# Patient Record
Sex: Male | Born: 1950
Health system: Southern US, Community
[De-identification: ages and names within clinical notes are randomized; demographics above are authoritative.]

## PROBLEM LIST (undated history)

## (undated) DIAGNOSIS — I252 Old myocardial infarction: Secondary | ICD-10-CM

## (undated) DIAGNOSIS — N201 Calculus of ureter: Secondary | ICD-10-CM

## (undated) DIAGNOSIS — Z87442 Personal history of urinary calculi: Secondary | ICD-10-CM

## (undated) DIAGNOSIS — E119 Type 2 diabetes mellitus without complications: Secondary | ICD-10-CM

## (undated) DIAGNOSIS — Z955 Presence of coronary angioplasty implant and graft: Secondary | ICD-10-CM

## (undated) DIAGNOSIS — I2510 Atherosclerotic heart disease of native coronary artery without angina pectoris: Secondary | ICD-10-CM

## (undated) HISTORY — PX: CORONARY ANGIOPLASTY WITH STENT PLACEMENT: SHX49

---

## 1996-12-11 DIAGNOSIS — I252 Old myocardial infarction: Secondary | ICD-10-CM

## 1996-12-11 HISTORY — DX: Old myocardial infarction: I25.2

## 2005-12-21 ENCOUNTER — Encounter: Admission: RE | Admit: 2005-12-21 | Discharge: 2005-12-21 | Payer: Self-pay | Admitting: Endocrinology

## 2006-09-20 IMAGING — US US SOFT TISSUE HEAD/NECK
1 series · 14 of 25 positions shown · non-contrast
Comparison: None.

CLINICAL DATA: Thyroid goiter felt on physical examination.

THYROID ULTRASOUND
TECHNIQUE: Ultrasound examination of the thyroid gland and adjacent soft tissue
structures was performed.

[Series 1: unknown · 0.10mm/px · 14 of 29 slices shown]
[im 1/29]
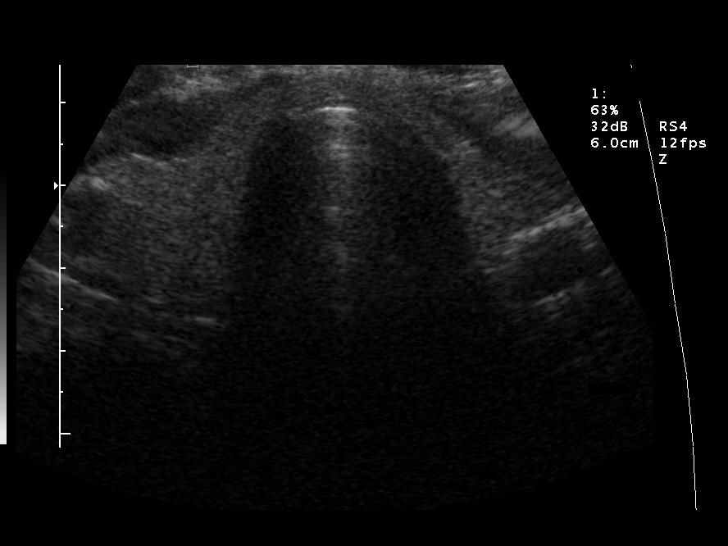
[im 3/29]
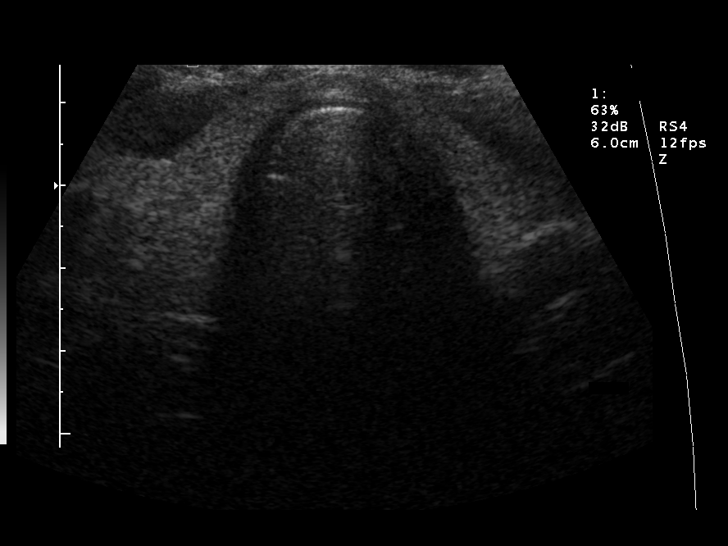
[im 5/29]
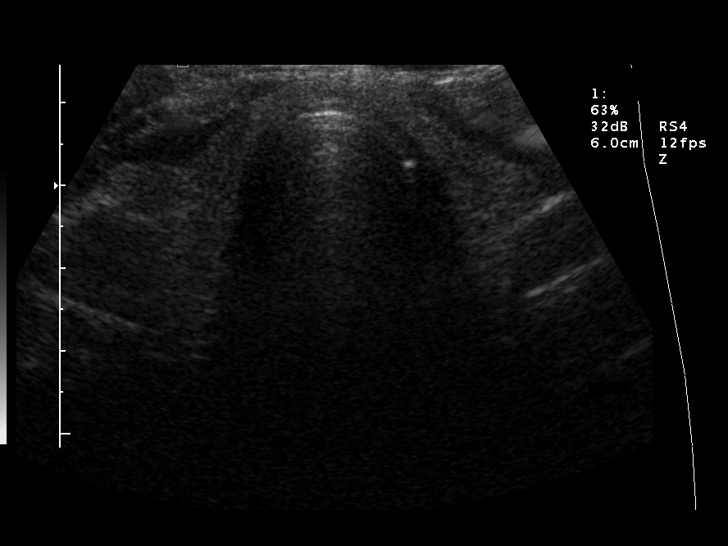
[im 8/29]
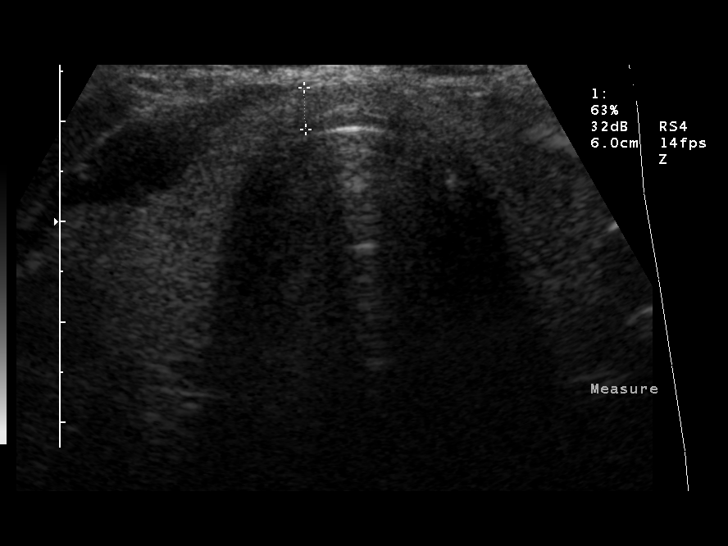
[im 10/29]
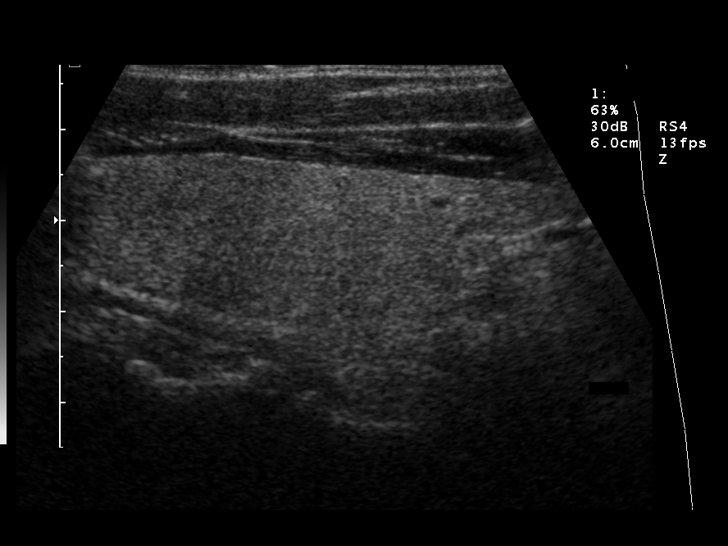
[im 11/29]
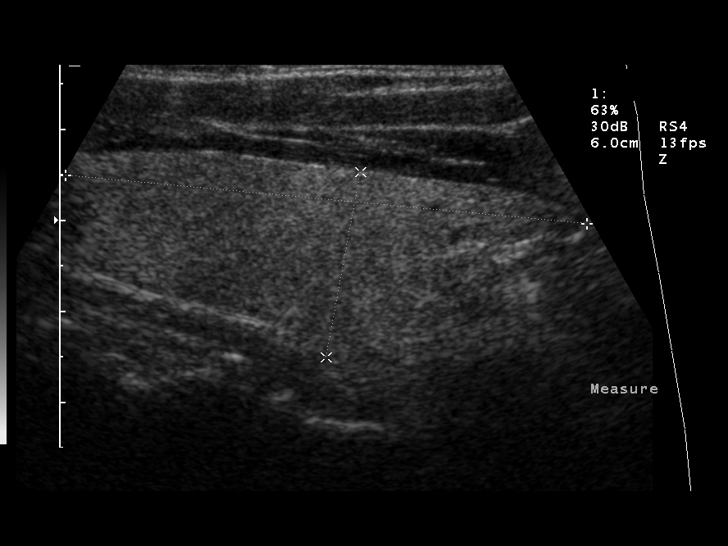
[im 13/29]
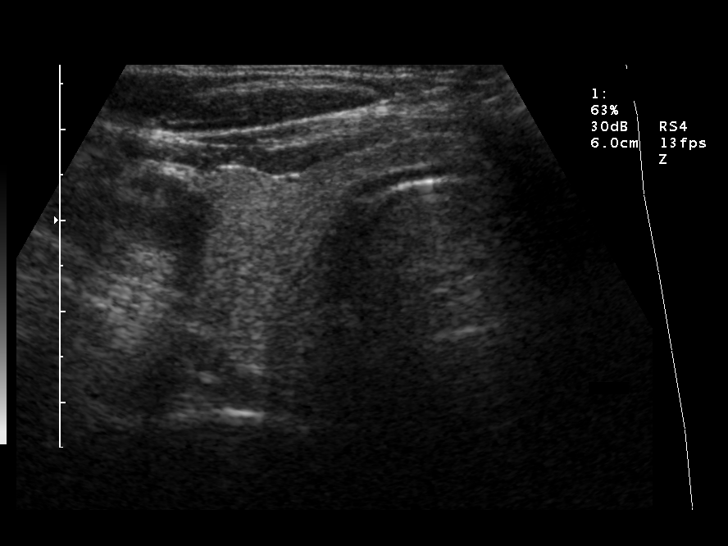
[im 16/29]
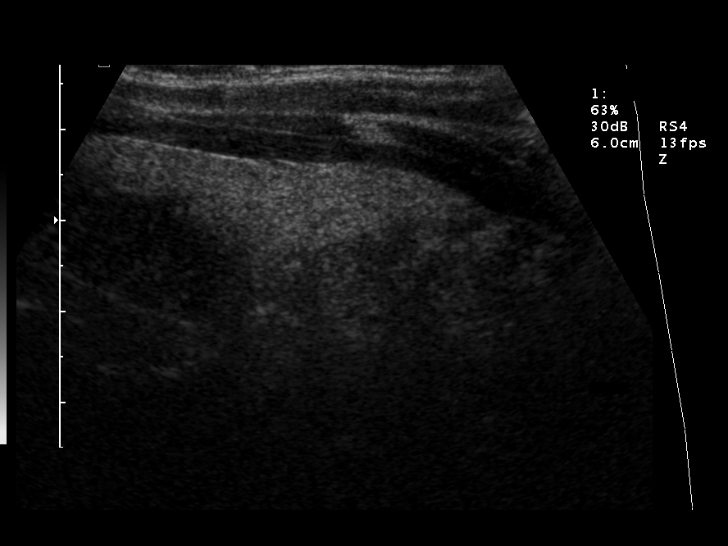
[im 18/29]
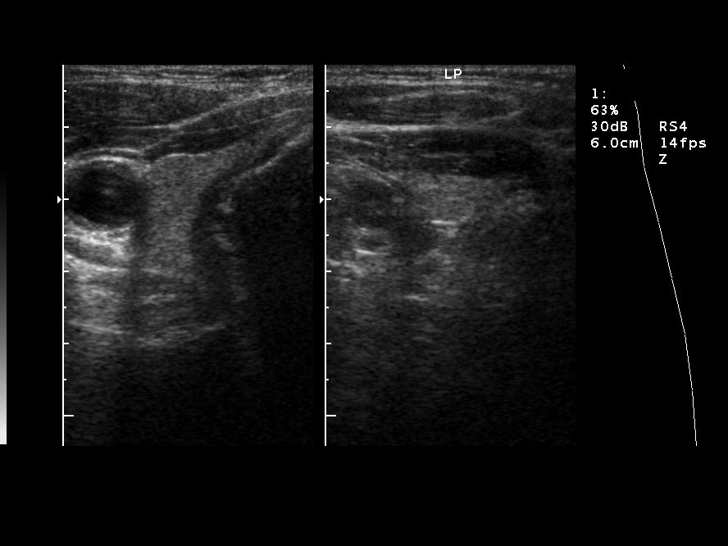
[im 19/29]
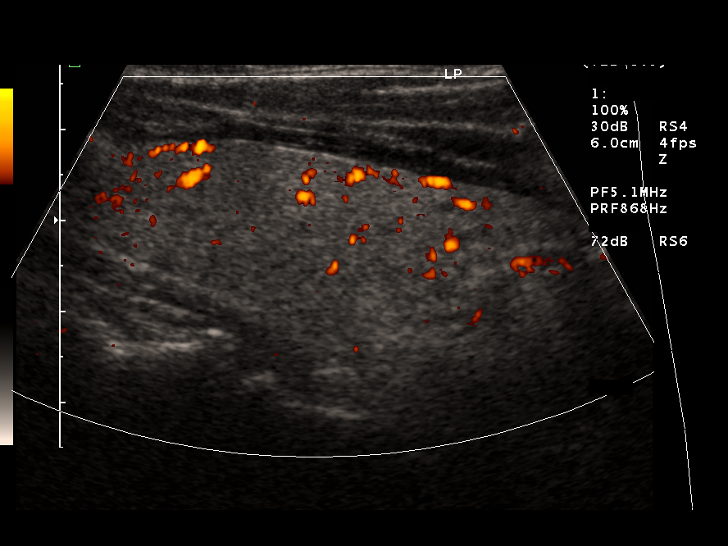
[im 22/29]
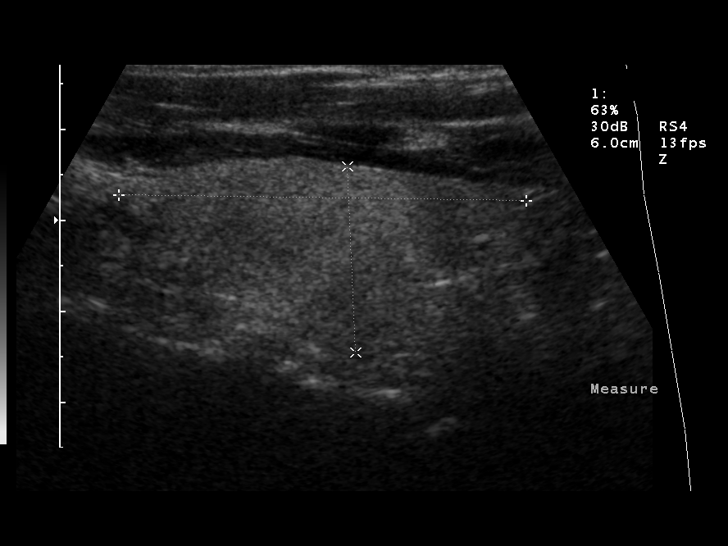
[im 24/29]
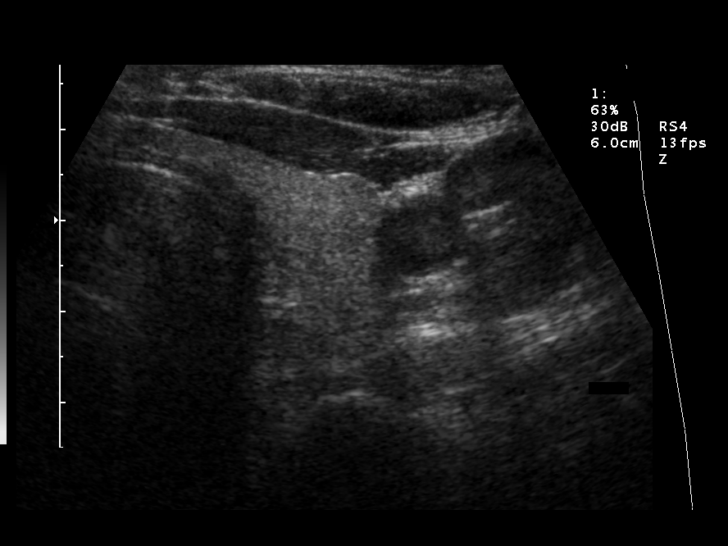
[im 26/29]
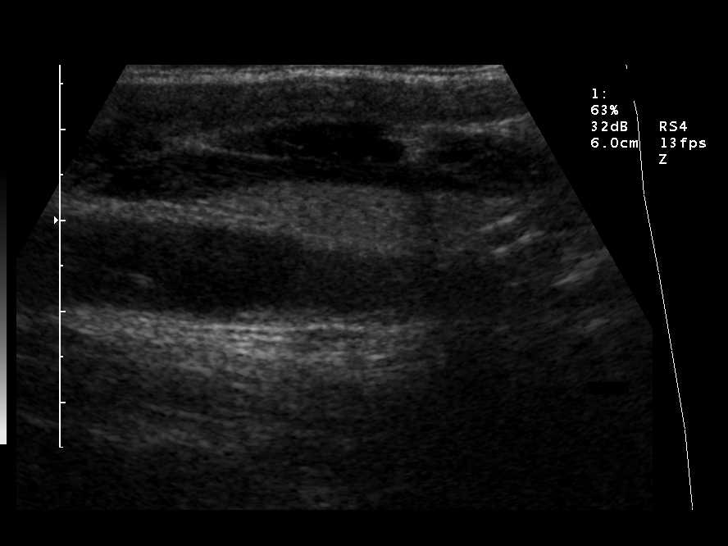
[im 29/29]
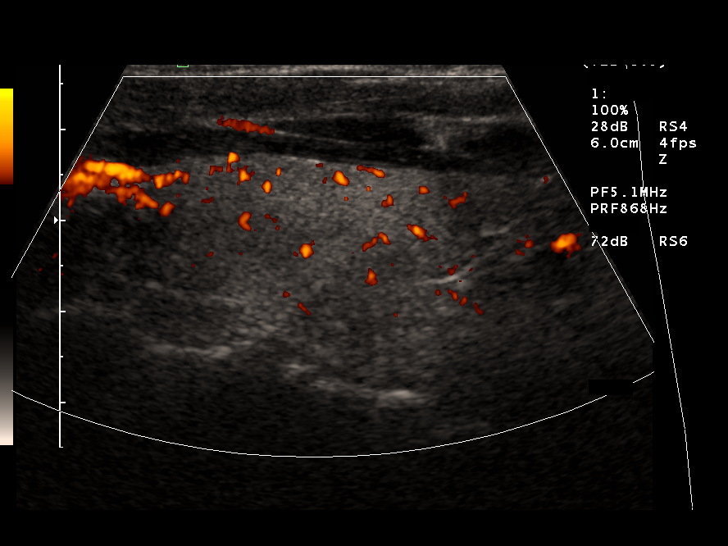

[14 of 25 positions shown; findings below may reference images not displayed]

FINDINGS: The right lobe of the thyroid gland is elongated, measuring 5.7 x
x 1.4 cm in maximum dimensions. The left lobe measures 4.6 x 1.8 x 1.4 cm in
maximum dimensions. The isthmus measures 3.8 mm in thickness in the midline. The
thyroid gland is homogeneous with no masses seen.

IMPRESSION

Elongated right lobe of the thyroid gland. Otherwise, normal examination.

## 2008-12-11 DIAGNOSIS — E119 Type 2 diabetes mellitus without complications: Secondary | ICD-10-CM

## 2008-12-11 HISTORY — DX: Type 2 diabetes mellitus without complications: E11.9

## 2011-03-02 ENCOUNTER — Inpatient Hospital Stay (HOSPITAL_COMMUNITY)
Admission: EM | Admit: 2011-03-02 | Discharge: 2011-03-04 | DRG: 247 | Disposition: A | Discharge status: Home / Self Care | Payer: Self-pay | Attending: Interventional Cardiology | Admitting: Interventional Cardiology

## 2011-03-02 ENCOUNTER — Emergency Department (HOSPITAL_COMMUNITY): Payer: Self-pay

## 2011-03-02 DIAGNOSIS — E785 Hyperlipidemia, unspecified: Secondary | ICD-10-CM | POA: Diagnosis present

## 2011-03-02 DIAGNOSIS — I2582 Chronic total occlusion of coronary artery: Secondary | ICD-10-CM | POA: Diagnosis present

## 2011-03-02 DIAGNOSIS — I2510 Atherosclerotic heart disease of native coronary artery without angina pectoris: Secondary | ICD-10-CM

## 2011-03-02 DIAGNOSIS — Z9861 Coronary angioplasty status: Secondary | ICD-10-CM

## 2011-03-02 DIAGNOSIS — I252 Old myocardial infarction: Secondary | ICD-10-CM

## 2011-03-02 DIAGNOSIS — E119 Type 2 diabetes mellitus without complications: Secondary | ICD-10-CM | POA: Diagnosis present

## 2011-03-02 DIAGNOSIS — I2119 ST elevation (STEMI) myocardial infarction involving other coronary artery of inferior wall: Principal | ICD-10-CM | POA: Diagnosis present

## 2011-03-02 DIAGNOSIS — I10 Essential (primary) hypertension: Secondary | ICD-10-CM | POA: Diagnosis present

## 2011-03-02 DIAGNOSIS — R61 Generalized hyperhidrosis: Secondary | ICD-10-CM | POA: Diagnosis present

## 2011-03-02 DIAGNOSIS — R0602 Shortness of breath: Secondary | ICD-10-CM | POA: Diagnosis present

## 2011-03-02 DIAGNOSIS — Z01810 Encounter for preprocedural cardiovascular examination: Secondary | ICD-10-CM

## 2011-03-02 DIAGNOSIS — R079 Chest pain, unspecified: Secondary | ICD-10-CM

## 2011-03-02 DIAGNOSIS — Z01812 Encounter for preprocedural laboratory examination: Secondary | ICD-10-CM

## 2011-03-02 DIAGNOSIS — Z7982 Long term (current) use of aspirin: Secondary | ICD-10-CM

## 2011-03-02 DIAGNOSIS — T82897A Other specified complication of cardiac prosthetic devices, implants and grafts, initial encounter: Secondary | ICD-10-CM | POA: Diagnosis present

## 2011-03-02 DIAGNOSIS — Y840 Cardiac catheterization as the cause of abnormal reaction of the patient, or of later complication, without mention of misadventure at the time of the procedure: Secondary | ICD-10-CM | POA: Diagnosis present

## 2011-03-02 LAB — BASIC METABOLIC PANEL
BUN: 20 mg/dL (ref 6–23)
BUN: 19 mg/dL (ref 6–23)
CO2: 30 mEq/L (ref 19–32)
Chloride: 101 mEq/L (ref 96–112)
GFR calc Af Amer: >60 mL/min (ref >60)
GFR calc Af Amer: >60 mL/min (ref >60)
GFR calc non Af Amer: >60 mL/min (ref >60)
Glucose, Bld: 241 mg/dL — ABNORMAL HIGH (ref 70–99)
Potassium: 3.6 mEq/L (ref 3.5–5.1)
Potassium: 4.6 mEq/L (ref 3.5–5.1)

## 2011-03-02 LAB — DIFFERENTIAL
Basophils Absolute: 0 10*3/uL (ref 0.0–0.1)
Basophils Relative: 0 % (ref 0–1)
Lymphs Abs: 4.6 10*3/uL — ABNORMAL HIGH (ref 0.7–4.0)

## 2011-03-02 LAB — CBC
HCT: 42.9 % (ref 39.0–52.0)
Hemoglobin: 15.5 g/dL (ref 13.0–17.0)
Hemoglobin: 14.3 g/dL (ref 13.0–17.0)
MCH: 31.8 pg (ref 26.0–34.0)
MCHC: 36 g/dL (ref 30.0–36.0)
MCV: 87.2 fL (ref 78.0–100.0)
RBC: 4.92 MIL/uL (ref 4.22–5.81)
RDW: 12.4 % (ref 11.5–15.5)
WBC: 13.6 10*3/uL — ABNORMAL HIGH (ref 4.0–10.5)

## 2011-03-02 LAB — POCT I-STAT, CHEM 8
BUN: 21 mg/dL (ref 6–23)
Chloride: 100 mEq/L (ref 96–112)
HCT: 38 % — ABNORMAL LOW (ref 39.0–52.0)
Hemoglobin: 12.9 g/dL — ABNORMAL LOW (ref 13.0–17.0)
Sodium: 135 mEq/L (ref 135–145)

## 2011-03-02 LAB — MRSA PCR SCREENING: MRSA by PCR: NEGATIVE

## 2011-03-02 LAB — POCT CARDIAC MARKERS
CKMB, poc: <1 ng/mL — ABNORMAL LOW (ref 1.0–8.0)
Troponin i, poc: <0.05 ng/mL (ref 0.00–0.09)

## 2011-03-02 LAB — PROTIME-INR: INR: 0.86 (ref 0.00–1.49)

## 2011-03-02 LAB — POCT ACTIVATED CLOTTING TIME: Activated Clotting Time: 464 seconds

## 2011-03-02 LAB — GLUCOSE, CAPILLARY

## 2011-03-02 LAB — HEMOGLOBIN A1C: Mean Plasma Glucose: 194 mg/dL — ABNORMAL HIGH (ref <117)

## 2011-03-02 LAB — APTT: aPTT: 30 seconds (ref 24–37)

## 2011-03-02 LAB — CARDIAC PANEL(CRET KIN+CKTOT+MB+TROPI)
Total CK: 311 U/L — ABNORMAL HIGH (ref 7–232)
Troponin I: 5.12 ng/mL (ref 0.00–0.06)

## 2011-03-03 HISTORY — PX: CORONARY ANGIOPLASTY WITH STENT PLACEMENT: SHX49

## 2011-03-03 LAB — BASIC METABOLIC PANEL
Calcium: 8.5 mg/dL (ref 8.4–10.5)
Creatinine, Ser: 0.82 mg/dL (ref 0.4–1.5)

## 2011-03-03 LAB — POCT ACTIVATED CLOTTING TIME: Activated Clotting Time: 523 seconds

## 2011-03-03 LAB — CARDIAC PANEL(CRET KIN+CKTOT+MB+TROPI)
CK, MB: 19 ng/mL (ref 0.3–4.0)
Total CK: 262 U/L — ABNORMAL HIGH (ref 7–232)

## 2011-03-04 LAB — CBC
HCT: 40.3 % (ref 39.0–52.0)
MCH: 31.3 pg (ref 26.0–34.0)
MCHC: 35 g/dL (ref 30.0–36.0)
MCV: 89.4 fL (ref 78.0–100.0)
RDW: 12.4 % (ref 11.5–15.5)

## 2011-03-04 LAB — HEMOGLOBIN A1C
Hgb A1c MFr Bld: 8.8 % — ABNORMAL HIGH (ref <5.7)
Mean Plasma Glucose: 206 mg/dL — ABNORMAL HIGH (ref <117)

## 2011-03-04 LAB — BASIC METABOLIC PANEL
BUN: 9 mg/dL (ref 6–23)
CO2: 27 mEq/L (ref 19–32)
Calcium: 9 mg/dL (ref 8.4–10.5)
GFR calc non Af Amer: >60 mL/min (ref >60)
Glucose, Bld: 191 mg/dL — ABNORMAL HIGH (ref 70–99)

## 2011-03-09 NOTE — H&P (Signed)
NAMECAROLYN, MANISCALCO             ACCOUNT NO.:  [id number]  MEDICAL RECORD NO.:  [id number]           PATIENT TYPE:  I  LOCATION:  2901                         FACILITY:  [facility]  PHYSICIAN:  Harlon Flor, MD   DATE OF BIRTH:  1951-11-30  DATE OF ADMISSION:  03/02/2011 DATE OF DISCHARGE:                             HISTORY & PHYSICAL   ADMISSION DIAGNOSIS:  Inferior ST-segment elevation myocardial infarction.  PRIMARY CARDIOLOGIST:  Lyn Records, MD.  CHIEF COMPLAINT:  Chest pain.  HISTORY OF PRESENT ILLNESS:  Mr. Rennert is a 60 year old white male with type 2 diabetes and coronary artery disease with previous PCI at [facility] in 1998 to his left circumflex, who presents to the ER tonight with acute onset of substernal chest pain, radiating to his arm that began at midnight tonight.  Pain was not going away on its own and he presented to the emergency room.  He also was somewhat short of breath and diaphoretic.  He had experienced some brief episodes of nausea as well.  He was identified as having an inferior ST-elevation MI and the cath lab was activated.  He has been taken there emergently now.  PAST MEDICAL HISTORY: 1. Coronary artery disease:  PCI to his left circumflex in 1998 at     [facility] per the patient's report. 2. Diabetes mellitus, type 2, on oral medications. 3. Hyperlipidemia. 4. Hypertension.  ALLERGIES:  No known drug allergies.  MEDICATIONS: 1. Metoprolol 50 mg b.i.d. 2. Metformin 1000 mg b.i.d. 3. Glimepiride 5 mg b.i.d. 4. Lisinopril 5 mg daily. 5. Simvastatin 40 mg daily. 6. Aspirin 325 daily. 7. Fish oil 1000 mg b.i.d.  FAMILY HISTORY:  He had a brother who had an MI in his 8s.  SOCIAL HISTORY:  The patient is a retired Clinical research associate.  He has a history of tobacco use up until recently.  Drinks alcohol occasionally.  REVIEW OF SYSTEMS:  Full review of systems is obtained and is negative except as described in HPI.  PHYSICAL  EXAM:  VITAL SIGNS:  Blood pressure 162/82, pulse 70, temperature afebrile, respirations 20. GENERAL:  No acute stress. HEENT:  Extraocular movements intact.  Oropharynx benign.  Nonicteric sclera. NECK:  Supple. CARDIOVASCULAR:  Regular rate and rhythm without murmurs, rubs, or gallops.  No jugular venous distention. LUNGS:  Clear to auscultation bilaterally. ABDOMEN:  Soft, nontender, nondistended. EXTREMITIES:  No clubbing, cyanosis, or edema.  Pulses, he has 2+ femoral, 2+ dorsalis pedis and posterior tibial pulses bilaterally. SKIN:  No rashes. LYMPH:  No lymphadenopathy. NEURO:  He moves all extremities well.  Cranial nerves II-XII are intact.  EKG shows normal sinus rhythm with inferior ST elevation and reciprocal changes in V1 and V2.  Chest x-ray and all of his lab work are pending.  ASSESSMENT AND PLAN:  Mr. Kolander is a 60 year old white male with type 2 diabetes and coronary artery disease with previous PCI to his left circumflex, who presents to the emergency room with an inferior ST- elevation myocardial infarction.  He has been taken emergently to the cath lab for primary PCI.  There is no contraindication with drug-  eluting stent.  He has been given heparin and aspirin.  Thienopyridine has been held until coronary angiography.  We will plan on putting him back on his home medications including metoprolol and his statin. Otherwise, hold his metformin and put him on a sliding scale insulin.     Harlon Flor, MD     MMB/MEDQ  D:  03/02/2011  T:  03/02/2011  Job:  [id number]  cc:   Lyn Records, M.D.  Electronically Signed by Meridee Score MD on 03/09/2011 08:17:08 PM

## 2011-03-13 NOTE — Procedures (Signed)
NAMEJAYON, [name]             ACCOUNT NO.:  [id number]  MEDICAL RECORD NO.:  [id number]           PATIENT TYPE:  I  LOCATION:  2901                         FACILITY:  [facility]  PHYSICIAN:  Veverly Fells. Excell Seltzer, MD  DATE OF BIRTH:  1951/04/11  DATE OF PROCEDURE:  03/02/2011 DATE OF DISCHARGE:                           CARDIAC CATHETERIZATION   PROCEDURE: 1. Left heart catheterization. 2. Selective coronary angiography. 3. Left ventricular angiography. 4. PTCA and stenting of the right coronary artery. 5. AngioSeal of the right femoral artery.  PROCEDURAL INDICATION:  [name] is a 60 year old gentleman with diabetes and coronary artery disease.  He is followed by Dr. Katrinka Blazing.  The patient underwent his last PCI procedure in 1998 by his report.  He has done very well since that time.  Tonight he developed left arm and chest tightness and was brought into the emergency department where his EKG showed inferior ST-segment elevation and a code STEMI was called.  He was brought emergently to the cardiac cath lab for cath and probable PCI.  Risks and indications of the procedure were reviewed with the patient. Emergency consent was obtained.  The right groin was prepped, draped, and anesthetized with 1% lidocaine using the modified Seldinger technique.  A 6-French sheath was placed in the right femoral artery.  A 6-French JL-4 diagnostic catheter was used to image the left coronary artery.  This demonstrated left-to-right collaterals to the right coronary artery which was a suspected infarct vessel.  A 6-French JR-4 guide catheter was inserted directly into the right coronary artery. The patient was given 60 mg of Effient and bivalirudin bolus and drip was started.  Once a therapeutic ACT was achieved, a cougar guidewire was used to cross the total occlusion in the mid right coronary artery within the stented segment.  The vessel was dilated with a 2.5 x 15-mm apex balloon which was  taken to 8 atmospheres.  This restored TIMI 3 flow.  The patient developed mild hypotension and bradycardia, but he did not require atropine.  There appeared to be diffuse in-stent restenosis in a short bare metal stent that had been previously placed in the RCA.  There was diffuse spasm following reperfusion and intracoronary nitroglycerin was administered.  This resolved the spasm. I elected to stent the vessel with a 3.5 x 20-mm Promus element drug- eluting stent.  The stent was carefully positioned and then was deployed at 16 atmospheres.  The stent appeared well expanded at the proximal distal edges but within the previously stented segment, the stent was somewhat underexpanded.  I postdilated the stent with 4.0 x 15 mm [place] apex balloon.  The balloon was taken to 18 atmospheres, maximum pressure on a total of three inflations.  This improved the expansion of the stented segment.  There was 10% residual stenosis.  There was TIMI 3 flow at the completion of the procedure.  The guide catheter was removed and a pigtail catheter was advanced into the left ventricle where ventriculography was performed.  A pullback across the aortic valve was done.  The femoral arteriotomy was then closed with an Angio-Seal device.  The patient tolerated  the procedure well.  There were no immediate complications.  FINDINGS:  Aortic pressure 133/77 with a mean of 100, left ventricular pressure 130/21.  Left ventriculography shows inferior hypokinesis.  Left ventricular ejection fraction is 50%.  Left mainstem:  The left main is widely patent.  There is no obstructive disease.  The left main divides into the LAD and left circumflex.  LAD:  The LAD is patent throughout its course to the left ventricular apex.  The proximal LAD has nonobstructive plaque with 30-40% stenosis. The first diagonal has diffuse 50-60% stenosis.  The mid LAD is patent. The apical portion of the LAD fills slowly.  Left  circumflex:  The proximal left circumflex is patent.  The mid left circumflex has an 80% focal stenosis and then a tandem 70% stenosis further down in the vessel.  There is a single obtuse marginal branch that is widely patent after this 70% lesion leading into that branch. The 2 lesions described appeared to be on the proximal and distal edges of a previously placed stent.  The stent itself is patent with mild diffuse in-stent restenosis.  Right coronary artery:  The right coronary artery is patent in the proximal aspect.  The mid segment is totally occluded within the stented segment with TIMI 0 flow and evidence of thrombus.  The PDA fills from left-to-right collaterals.  Left ventriculography shows inferior wall hypokinesis.  The left ventricular ejection fraction is estimated at 50%.  FINAL ASSESSMENT: 1. Total occlusion of the right coronary artery with successful     primary PCI using a drug-eluting stent platform. 2. Nonobstructive left anterior descending stenosis. 3. Severe left circumflex stenosis. 4. Mild segmental left ventricular systolic dysfunction.  RECOMMENDATIONS:  The patient will receive post MI medical therapy.  He was started on aspirin and prasugrel.  Consideration of staged PCI of the left circumflex at the discretion of Dr. Katrinka Blazing.     Veverly Fells. Excell Seltzer, MD     MDC/MEDQ  D:  03/02/2011  T:  03/02/2011  Job:  [id number]  cc:   Lyn Records, M.D. Dorisann Frames, M.D.  Electronically Signed by Tonny Bollman MD on 03/13/2011 01:13:15 PM

## 2011-03-31 NOTE — Cardiovascular Report (Signed)
  [name], [name]             ACCOUNT NO.:  [id number]  MEDICAL RECORD NO.:  [id number]           PATIENT TYPE:  O  LOCATION:  2807                         FACILITY:  [facility]  PHYSICIAN:  Lyn Records, M.D.   DATE OF BIRTH:  May 01, 1951  DATE OF PROCEDURE: DATE OF DISCHARGE:                           CARDIAC CATHETERIZATION   INDICATIONS FOR PROCEDURE:  Stage procedure following an acute inferior infarction where he presented with right coronary stent thrombosis.  He was found to have high-grade restenosis within a previously placed circumflex stent from 1995.  Study is being done to treat residual left circumflex disease.  PROCEDURE PERFORMED:  Drug-eluting stent implantation circumflex.  DESCRIPTION:  We performed this procedure via the right radial approach. Consent was obtained after discussing potential risks and benefits of the procedure including stroke, death, myocardial infarction, bypass surgery, bleeding and limb ischemia.  We obtained access without difficulty.  We used a 6-French system.  A left coronary XB 6-French 3.5 cm guide catheter was used.  Angiomax and bolus were given to get ACT greater than 300.  A 20 mg of Effient was administered.  He had been loaded with Effient 36 hours earlier.  We used BMW wire and predilated with a 25 x 20 mm long balloon.  We then deployed a XIENCE 2.75 x 33 mm long stent to cover the entire mid right coronary segment where there was restenosis pre and post the previously placed stent.  We deployed the stent at nominal pressure of 8 atmospheres.  We then postdilated with a 20-mm long x 2.75-mm [place] balloon to 15 atmospheres.  The proximal two-thirds of the stent was further dilated with high pressure using a 3.0 x 20 mm long [place] balloon to 15 atmospheres.  IV nitroglycerin was administered on multiple occasions, but prior to the final images to 100 mcg was also given.  A very nice angiographic result was obtained. Hemostasis was  achieved with a wristband.  CONCLUSION:  Successful stenting of the mid circumflex from tandem 90 and 60-70% stenoses to 0% with a single 33-mm long XIENCE drug-eluting stent with TIMI grade 3 flow result.  PLAN:  Effient and aspirin therapy indefinitely.  Discharge within 24-36 hours.     Lyn Records, M.D.     HWS/MEDQ  D:  03/03/2011  T:  03/04/2011  Job:  [id number]  Electronically Signed by Verdis Prime M.D. on 03/31/2011 04:54:56 PM

## 2011-03-31 NOTE — Discharge Summary (Signed)
[name], [name]             ACCOUNT NO.:  [id number]  MEDICAL RECORD NO.:  [id number]           PATIENT TYPE:  O  LOCATION:  6529                         FACILITY:  [facility]  PHYSICIAN:  Lyn Records, M.D.   DATE OF BIRTH:  1951-06-08  DATE OF ADMISSION:  03/02/2011 DATE OF DISCHARGE:  03/04/2011                              DISCHARGE SUMMARY   REASON FOR ADMISSION:  Acute inferior wall ST elevation MI.  DISCHARGE DIAGNOSES: 1. Acute inferior myocardial infarction treated with drug-eluting     stent implantation on March 02, 2011. 2. Coronary atherosclerotic heart disease with high-grade tandem     lesions in the circumflex coronary artery found at the time of     acute intervention, treated with drug-eluting stent on March 03, 2011. 3. History of prior infarctions but with low normal LV function and     inferior wall hypokinesis, EF 50%. 4. Diabetes mellitus. 5. Hyperlipidemia. 6. Hypertension.  PROCEDURES PERFORMED: 1. Emergency catheterization with RCA DES by Dr. Tonny Bollman on     March 02, 2011. 2. Elective circumflex tandem lesions stent by Dr. Verdis Prime on     March 03, 2011.  DISCHARGE RECOMMENDATIONS: 1. Follow up with Dr. Verdis Prime on March 15, 2011. 2. Utilize the cardiac rehabilitation instructions for activity     progression over the next 7-10 days. 3. Call if any recurrent arm discomfort (index symptom).  MEDICATIONS: 1. Aspirin 325 mg per day. 2. Prasugrel 10 mg per day. 3. Metoprolol tartrate 50 mg twice a day. 4. Nitroglycerin 0.4 mg sublingually if chest discomfort. 5. Glimepiride 4 mg 1 tablet twice daily. 6. Lisinopril 5 mg daily. 7. Metformin XR 500 mg 2 tablets twice daily. 8. Simvastatin 40 mg daily.  DIET:  Heart-healthy modified carbohydrate diet.  HISTORY AND PHYSICAL AND HOSPITAL COURSE:  Please review admitting history and physical.  The patient has longstanding history of coronary artery disease and had undergone  circumflex and right coronary artery stenting in the late 1990s at [facility] in [place].  He is a type 2 diabetic.  Early in the a.m. on March 03, 2011, he presented with inferior ST elevation and was noted to have total occlusion of the right coronary within the previously stented segment of the right coronary.  This vessel was successfully reopened and stented by Dr. Tonny Bollman.  A drug-eluting stent was placed.  Residual disease was found at the time of cath involving high-grade stenosis at the margins of a previously placed stent in the left circumflex from the late 1990s.  On March 03, 2011, the patient underwent successful stenting of this lesion.  Please refer to the procedure note.  Drug-eluting stent was also use in this territory.  On admission to the hospital and subsequently laboratory data of significance included a hemoglobin of 14.3, white count of 14,600, creatinine of 0.98, which was 0.82 on August 02, 2010, prior to circumflex stenting.  Peak CK-MB was 311/31.  The patient's EKG post infarction demonstrated inferior T-wave inversion.  Hemoglobin A1c is not available at the time of this dictation.  On the day  prior to discharge, the patient's radial cath site for the circumflex intervention and the right femoral cath site from the right coronary intervention are without evidence of local abnormality or hematoma.  The patient's condition at discharge presumed to recur on March 04, 2011, is improved.     Lyn Records, M.D.     HWS/MEDQ  D:  03/03/2011  T:  03/04/2011  Job:  [id number]  Electronically Signed by Verdis Prime M.D. on 03/31/2011 04:54:51 PM

## 2011-11-30 IMAGING — CR DG CHEST 1V PORT
2 series · 2 of 2 positions shown · non-contrast
Comparison: None.

CLINICAL DATA: Chest pain.

PORTABLE CHEST - 1 VIEW

[view not recorded (1 of 2)]
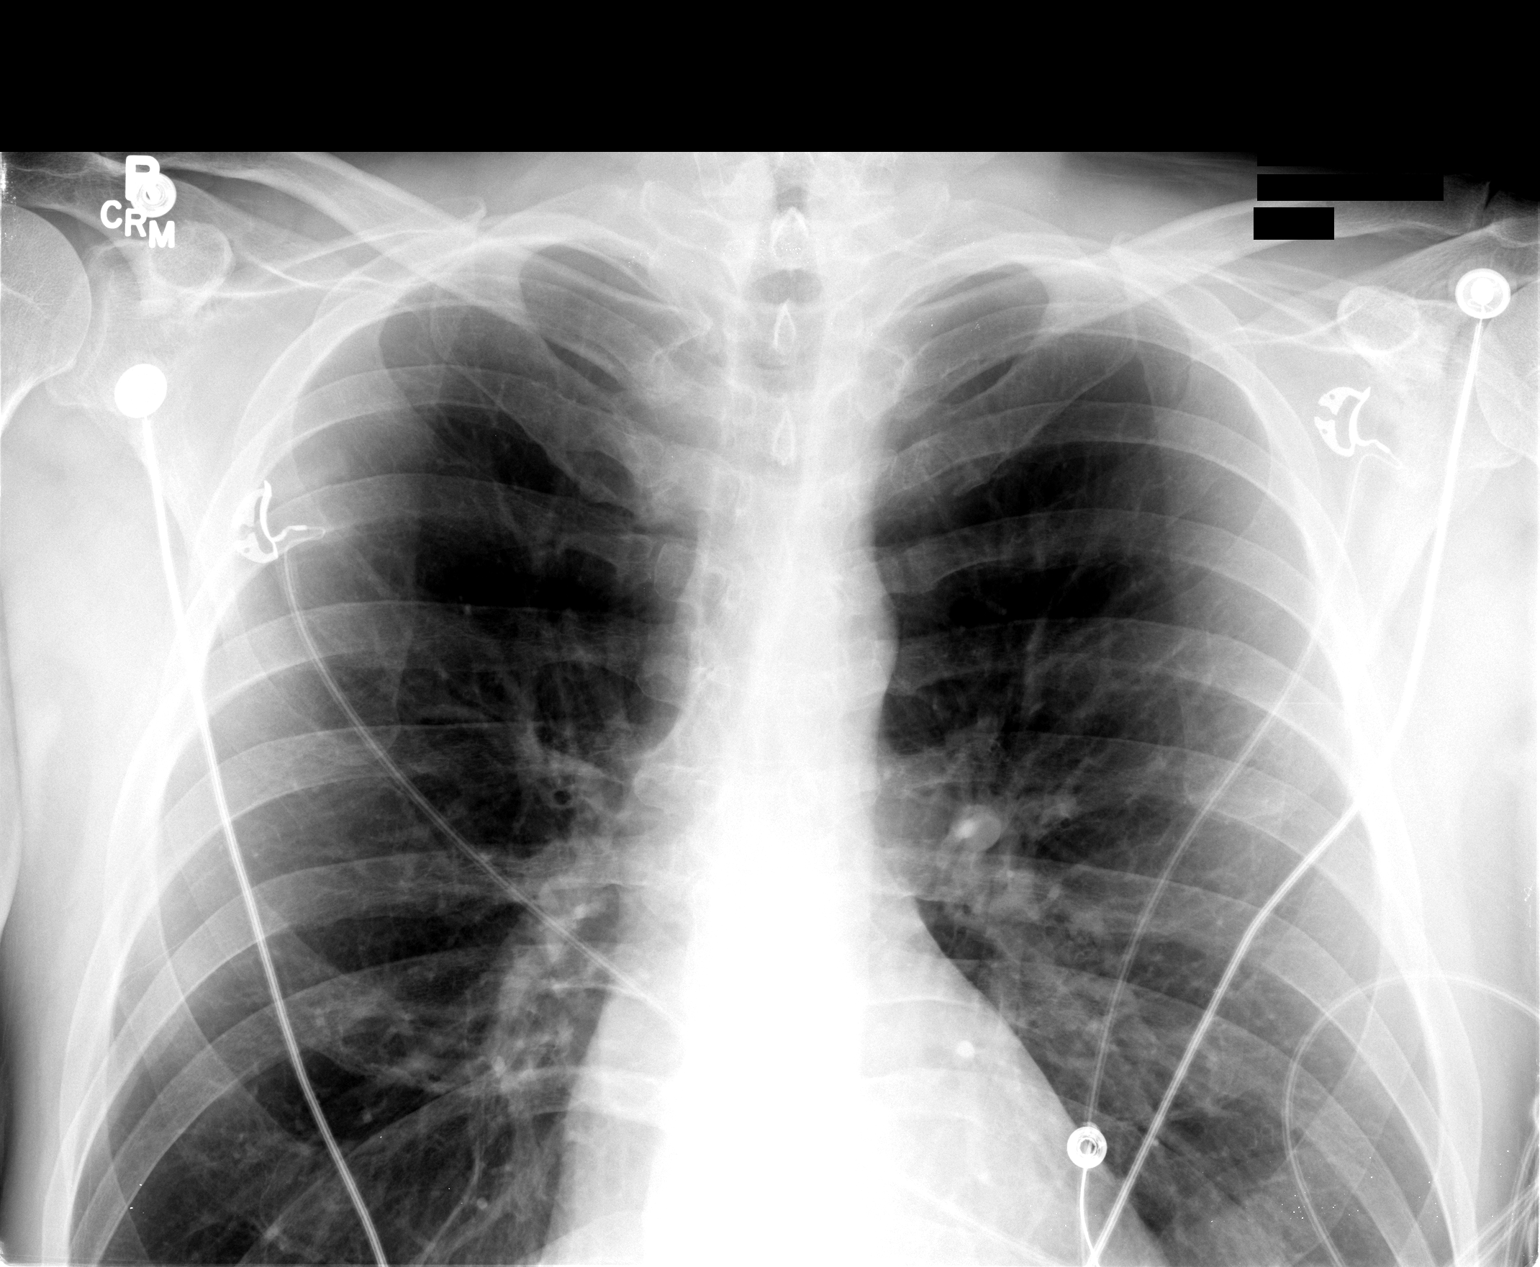

[view not recorded (2 of 2)]
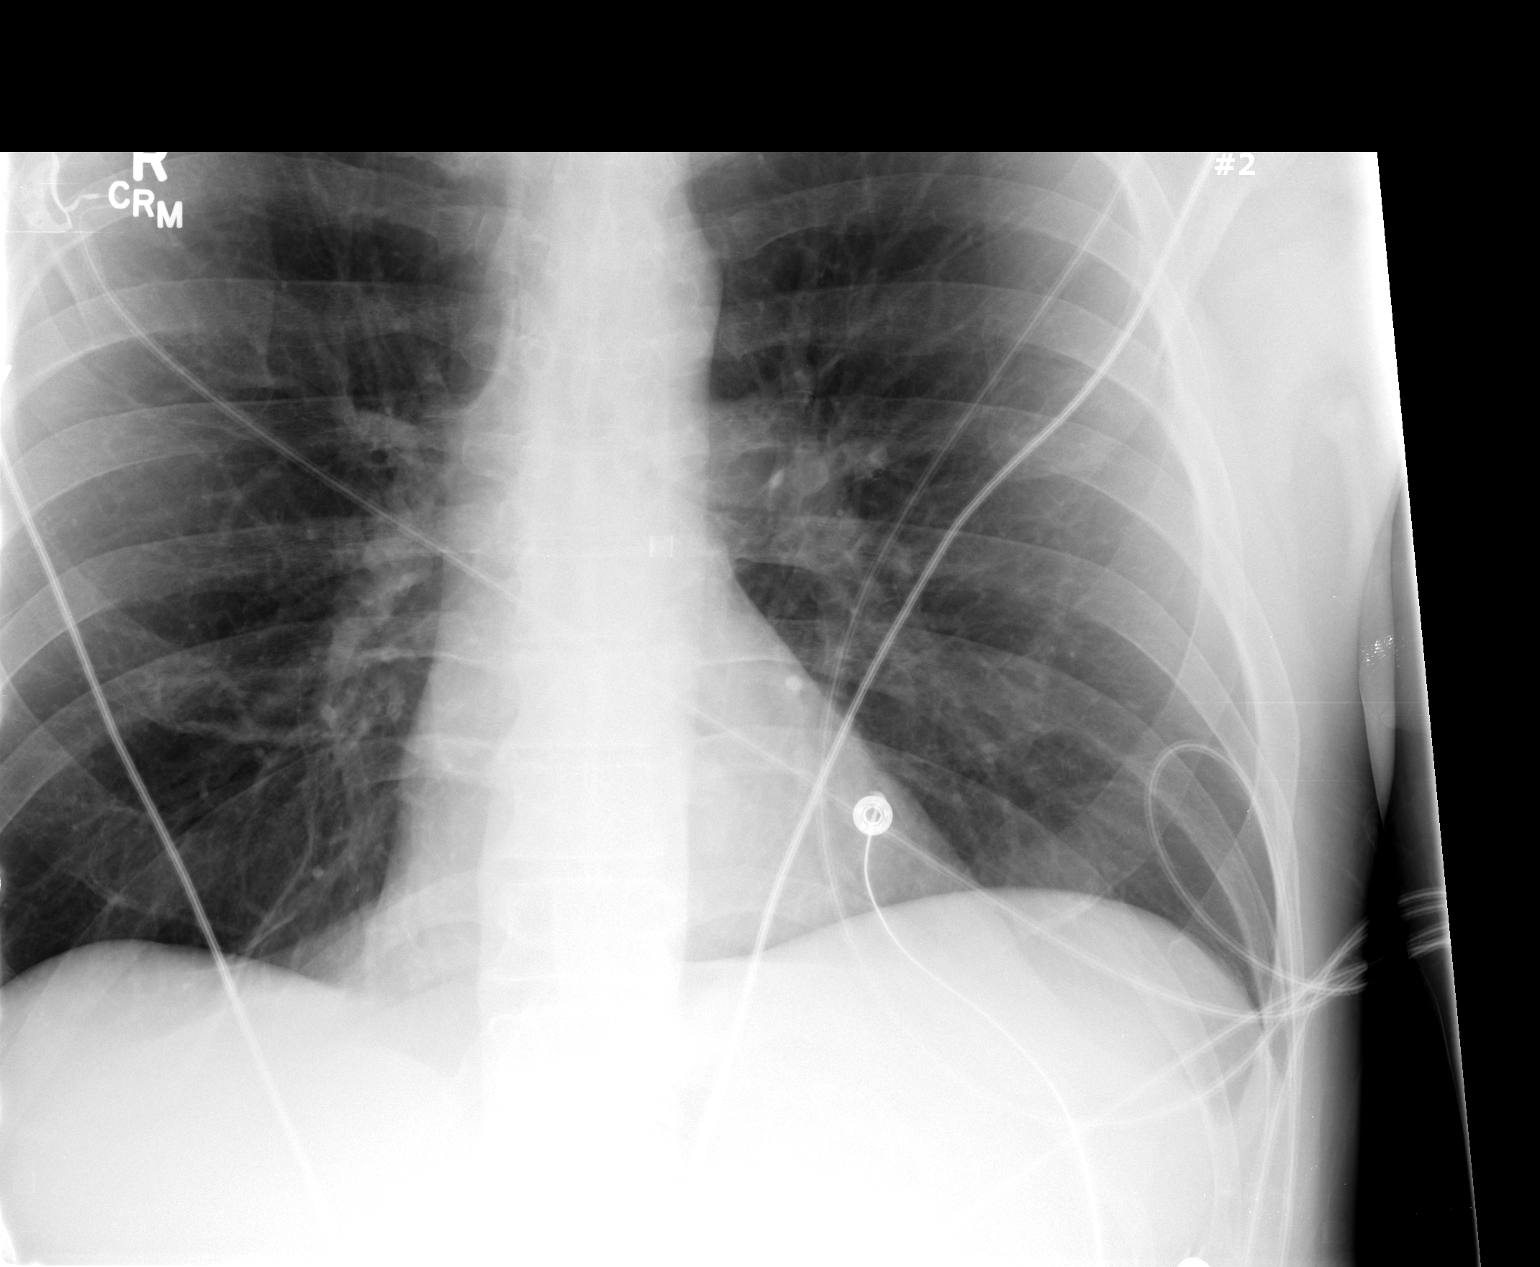

[2 of 2 positions shown; findings below may reference images not displayed]

FINDINGS: Right costophrenic angle is off the margin of the film.
Lungs appear clear.  Heart size normal.  No focal bony abnormality.
IMPRESSION: No acute finding.

## 2013-02-12 ENCOUNTER — Encounter (HOSPITAL_BASED_OUTPATIENT_CLINIC_OR_DEPARTMENT_OTHER): Payer: Self-pay | Admitting: *Deleted

## 2013-02-12 ENCOUNTER — Other Ambulatory Visit: Payer: Self-pay | Admitting: Urology

## 2013-02-12 NOTE — Progress Notes (Addendum)
To [facility] at 0615 -Istat,Ekg on arrival-Npo after Mn-will take metoprolol with small amt water that am-patient has left message with Dr Garnette Scheuermann regarding procedure and anticoagulant needs.Will have anesthesia review due to cardiac history.  Reviewed chart w dr Acey Lav, ok to proceed.

## 2013-02-13 ENCOUNTER — Encounter (HOSPITAL_BASED_OUTPATIENT_CLINIC_OR_DEPARTMENT_OTHER): Payer: Self-pay | Admitting: *Deleted

## 2013-02-16 ENCOUNTER — Encounter (HOSPITAL_BASED_OUTPATIENT_CLINIC_OR_DEPARTMENT_OTHER): Payer: Self-pay | Admitting: Anesthesiology

## 2013-02-16 NOTE — Anesthesia Preprocedure Evaluation (Deleted)
Anesthesia Evaluation  Patient identified by MRN, date of birth, ID band Patient awake    Reviewed: Allergy & Precautions, H&P , NPO status , Patient's Chart, lab work & pertinent test results, reviewed documented beta blocker date and time   Airway Mallampati: II TM Distance: >3 FB Neck ROM: full    Dental no notable dental hx.    Pulmonary neg pulmonary ROS,  breath sounds clear to auscultation  Pulmonary exam normal       Cardiovascular Exercise Tolerance: Good + CAD and + Cardiac Stents negative cardio ROS  Rhythm:regular Rate:Normal  STEMI 03/01/12   Neuro/Psych negative neurological ROS  negative psych ROS   GI/Hepatic negative GI ROS, Neg liver ROS,   Endo/Other  negative endocrine ROSdiabetes, Type 2, Insulin Dependent and Oral Hypoglycemic Agents  Renal/GU negative Renal ROS  negative genitourinary   Musculoskeletal   Abdominal   Peds  Hematology negative hematology ROS (+)   Anesthesia Other Findings   Reproductive/Obstetrics negative OB ROS                          Anesthesia Physical Anesthesia Plan  ASA: III  Anesthesia Plan: General   Post-op Pain Management:    Induction: Intravenous  Airway Management Planned: LMA  Additional Equipment:   Intra-op Plan:   Post-operative Plan: Extubation in OR  Informed Consent: I have reviewed the patients History and Physical, chart, labs and discussed the procedure including the risks, benefits and alternatives for the proposed anesthesia with the patient or authorized representative who has indicated his/her understanding and acceptance.   Dental Advisory Given  Plan Discussed with: CRNA  Anesthesia Plan Comments: (Patient passed stone last night. Surgery cancelled.)       Anesthesia Quick Evaluation

## 2013-02-17 ENCOUNTER — Ambulatory Visit (HOSPITAL_BASED_OUTPATIENT_CLINIC_OR_DEPARTMENT_OTHER)
Admission: RE | Admit: 2013-02-17 | Payer: No Typology Code available for payment source | Source: Ambulatory Visit | Admitting: Urology

## 2013-02-17 ENCOUNTER — Encounter (HOSPITAL_BASED_OUTPATIENT_CLINIC_OR_DEPARTMENT_OTHER): Payer: Self-pay | Admitting: Anesthesiology

## 2013-02-17 HISTORY — DX: Presence of coronary angioplasty implant and graft: Z95.5

## 2013-02-17 HISTORY — DX: Atherosclerotic heart disease of native coronary artery without angina pectoris: I25.10

## 2013-02-17 HISTORY — DX: Personal history of urinary calculi: Z87.442

## 2013-02-17 HISTORY — DX: Old myocardial infarction: I25.2

## 2013-02-17 HISTORY — DX: Calculus of ureter: N20.1

## 2013-02-17 HISTORY — DX: Type 2 diabetes mellitus without complications: E11.9

## 2013-02-17 SURGERY — CYSTOURETEROSCOPY, WITH RETROGRADE PYELOGRAM AND STENT INSERTION
Anesthesia: General | Laterality: Left

## 2014-01-11 ENCOUNTER — Other Ambulatory Visit: Payer: Self-pay | Admitting: *Deleted

## 2014-01-11 DIAGNOSIS — E782 Mixed hyperlipidemia: Secondary | ICD-10-CM

## 2014-03-09 ENCOUNTER — Other Ambulatory Visit (INDEPENDENT_AMBULATORY_CARE_PROVIDER_SITE_OTHER): Payer: No Typology Code available for payment source

## 2014-03-09 DIAGNOSIS — E782 Mixed hyperlipidemia: Secondary | ICD-10-CM

## 2014-03-09 LAB — HEPATIC FUNCTION PANEL
ALK PHOS: 89 U/L (ref 39–117)
ALT: 23 U/L (ref 0–53)
AST: 15 U/L (ref 0–37)
Albumin: 4.2 g/dL (ref 3.5–5.2)
Bilirubin, Direct: 0 mg/dL (ref 0.0–0.3)
TOTAL PROTEIN: 6.9 g/dL (ref 6.0–8.3)
Total Bilirubin: 0.6 mg/dL (ref 0.3–1.2)

## 2014-03-09 LAB — LIPID PANEL
Cholesterol: 160 mg/dL (ref 0–200)
HDL: 38.5 mg/dL — ABNORMAL LOW (ref >39.00)
LDL Cholesterol: 98 mg/dL (ref 0–99)
Total CHOL/HDL Ratio: 4
Triglycerides: 116 mg/dL (ref 0.0–149.0)
VLDL: 23.2 mg/dL (ref 0.0–40.0)

## 2014-05-19 ENCOUNTER — Encounter: Payer: Self-pay | Admitting: Interventional Cardiology

## 2014-06-17 ENCOUNTER — Ambulatory Visit: Payer: No Typology Code available for payment source | Admitting: Interventional Cardiology

## 2014-07-07 ENCOUNTER — Encounter: Payer: Self-pay | Admitting: Interventional Cardiology

## 2014-07-07 ENCOUNTER — Ambulatory Visit (INDEPENDENT_AMBULATORY_CARE_PROVIDER_SITE_OTHER): Payer: No Typology Code available for payment source | Admitting: Interventional Cardiology

## 2014-07-07 VITALS — BP 142/82 | HR 58 | Ht 74.0 in | Wt 188.0 lb

## 2014-07-07 DIAGNOSIS — I25810 Atherosclerosis of coronary artery bypass graft(s) without angina pectoris: Secondary | ICD-10-CM

## 2014-07-07 DIAGNOSIS — E119 Type 2 diabetes mellitus without complications: Secondary | ICD-10-CM

## 2014-07-07 DIAGNOSIS — I10 Essential (primary) hypertension: Secondary | ICD-10-CM

## 2014-07-07 NOTE — Patient Instructions (Signed)
Your physician recommends that you continue on your current medications as directed. Please refer to the Current Medication list given to you today.  Your physician discussed the importance of regular exercise and recommended that you start or continue a regular exercise program for good health.  Your physician wants you to follow-up in: 1 year with Dr.Smith You will receive a reminder letter in the mail two months in advance. If you don't receive a letter, please call our office to schedule the follow-up appointment.  

## 2014-07-07 NOTE — Progress Notes (Signed)
Patient ID: [name], male   DOB: 15-Jun-1951, 63 y.o.   MRN: [id number]    [address], [address], [place]  [address] Phone: [phone] Fax:  [phone]  Date:  07/07/2014   ID:  [name], DOB 1951-09-03, MRN [id number]  PCP:  Default, Provider, MD   ASSESSMENT:  1. CAD with right coronary and circumflex stenting with no anginal complaints 2. Hypertension under control 3. Hyperlipidemia 4. Diabetes mellitus, poorly controlled  PLAN:  1. Continue current medical regimen. 2. Continue physical activity 3. A followup in one year    SUBJECTIVE: [name] is a 63 y.o. male who has no cardiac complaints. He denies angina. No nitroglycerin use. He gives me the unfortunate and told us that his wife died suddenly last fall. His been a lot of emotional stress. 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 He seems to be handling the stress relatively well. He is compliant with his medical regimen.   Wt Readings from Last 3 Encounters:  07/07/14 188 lb (85.276 kg)  02/12/13 187 lb (84.823 kg)  02/12/13 187 lb (84.823 kg)     Past Medical History  Diagnosis Date  . Ureteral calculi left  . Diabetes mellitus without complication 7253    oral,insulin management  . History of MI (myocardial infarction) 1998    . S/P drug eluting coronary stent placement   . History of acute inferior wall myocardial infarction     03-01-2012  STEMI  . Coronary artery disease CARDIOLOGIST- DR [name]  . History of kidney stones     Current Outpatient Prescriptions  Medication Sig Dispense Refill  . aspirin 325 MG tablet Take 325 mg by mouth daily.      . fish oil-omega-3 fatty acids 1000 MG capsule Take 4 g by mouth daily.       . insulin lispro protamine-insulin lispro (HUMALOG 75/25) (75-25) 100 UNIT/ML SUSP Inject into the skin 2 (two) times daily with a meal.      . metFORMIN (GLUCOPHAGE) 500 MG tablet Take 500 mg by mouth 2 (two) times daily  with a meal.      . metoprolol (LOPRESSOR) 50 MG tablet Take 50 mg by mouth 2 (two) times daily.      . Multiple Vitamin (MULTIVITAMIN) tablet Take 1 tablet by mouth daily.      . simvastatin (ZOCOR) 40 MG tablet Take 40 mg by mouth every evening.       No current facility-administered medications for this visit.    Allergies:   No Known Allergies  Social History:  The patient  reports that he quit smoking about 25 years ago. He does not have any smokeless tobacco history on file. He reports that he drinks alcohol.   ROS:  Please see the history of present illness.   No palpitations.   All other systems reviewed and negative.   OBJECTIVE: VS:  BP 142/82  Pulse 58  Ht 6\' 2"  (1.88 m)  Wt 188 lb (85.276 kg)  BMI 24.13 kg/m2 Well nourished, well developed, in no acute distress, healthy-appearing  HEENT: normal Neck: JVD flat. Carotid bruit absent  Cardiac:  normal S1, S2; RRR; no murmur Lungs:  clear to auscultation bilaterally, no wheezing, rhonchi or rales Abd: soft, nontender, no hepatomegaly Ext: Edema none. Pulses 2+ and symmetric  Skin: warm and dry Neuro:  CNs 2-12 intact, no focal abnormalities noted  EKG:  Normal  Signed, Illene Labrador III, MD 07/07/2014 10:54 AM

## 2014-07-09 DIAGNOSIS — E119 Type 2 diabetes mellitus without complications: Secondary | ICD-10-CM | POA: Insufficient documentation

## 2014-07-09 DIAGNOSIS — I10 Essential (primary) hypertension: Secondary | ICD-10-CM | POA: Insufficient documentation

## 2014-07-09 DIAGNOSIS — I25810 Atherosclerosis of coronary artery bypass graft(s) without angina pectoris: Secondary | ICD-10-CM | POA: Insufficient documentation

## 2014-09-24 ENCOUNTER — Other Ambulatory Visit: Payer: Self-pay | Admitting: Interventional Cardiology

## 2015-08-26 ENCOUNTER — Ambulatory Visit (INDEPENDENT_AMBULATORY_CARE_PROVIDER_SITE_OTHER): Payer: No Typology Code available for payment source | Admitting: Interventional Cardiology

## 2015-08-26 ENCOUNTER — Encounter: Payer: Self-pay | Admitting: Interventional Cardiology

## 2015-08-26 VITALS — BP 140/88 | HR 55 | Ht 74.0 in | Wt 180.8 lb

## 2015-08-26 DIAGNOSIS — I25810 Atherosclerosis of coronary artery bypass graft(s) without angina pectoris: Secondary | ICD-10-CM

## 2015-08-26 DIAGNOSIS — E119 Type 2 diabetes mellitus without complications: Secondary | ICD-10-CM

## 2015-08-26 DIAGNOSIS — I10 Essential (primary) hypertension: Secondary | ICD-10-CM | POA: Diagnosis not present

## 2015-08-26 DIAGNOSIS — E785 Hyperlipidemia, unspecified: Secondary | ICD-10-CM

## 2015-08-26 LAB — ALT: ALT: 21 U/L (ref 0–53)

## 2015-08-26 LAB — LIPID PANEL
Cholesterol: 131 mg/dL (ref 0–200)
HDL: 41.4 mg/dL (ref >39.00)
LDL Cholesterol: 73 mg/dL (ref 0–99)
NonHDL: 89.37
Total CHOL/HDL Ratio: 3
Triglycerides: 82 mg/dL (ref 0.0–149.0)
VLDL: 16.4 mg/dL (ref 0.0–40.0)

## 2015-08-26 NOTE — Patient Instructions (Signed)
Medication Instructions:  Your physician recommends that you continue on your current medications as directed. Please refer to the Current Medication list given to you today.   Labwork: Lipid and Alt today  Testing/Procedures: None ordered  Follow-Up: Your physician wants you to follow-up in: 1 year with Dr.Smith You will receive a reminder letter in the mail two months in advance. If you don't receive a letter, please call our office to schedule the follow-up appointment.   Any Other Special Instructions Will Be Listed Below (If Applicable).

## 2015-08-26 NOTE — Progress Notes (Signed)
Cardiology Office Note   Date:  08/26/2015   ID:  [name], DOB 11-02-1951, MRN [id number]  PCP:  Default, Provider, MD  Cardiologist:  Sinclair Grooms, MD   Chief Complaint  Patient presents with  . Coronary Artery Disease      History of Present Illness: [name] is a 64 y.o. male who presents for CAD, prior PTCA of the RCA during MI remote, 1998, subsequent RCA stent during the acute MI with DES placement 2013, and staged PCI with stenting of the circumflex in 2013. Also history of hypertension, diabetes, and hyperlipidemia.  He is asymptomatic. He is keeping his weight down. Diabetes is being managed by Dr. Michiel Sites. No specific problems relative to that diagnosis. He has not needed sublingual nitroglycerin. No recent lipid determination. He denies claudication.    Past Medical History  Diagnosis Date  . Ureteral calculi left  . Diabetes mellitus without complication 6160    oral,insulin management  . History of MI (myocardial infarction) 1998    . S/P drug eluting coronary stent placement   . History of acute inferior wall myocardial infarction     03-01-2012  STEMI  . Coronary artery disease CARDIOLOGIST- DR [name]  . History of kidney stones     Past Surgical History  Procedure Laterality Date  . Coronary angioplasty with stent placement  1998  [facility])    [place]  . Coronary angioplasty with stent placement  03/03/2011    STENTING OF IN-STENT RESTENOSIS OF LEFT CIRDUMFLEX  (drug-eluting)     Current Outpatient Prescriptions  Medication Sig Dispense Refill  . aspirin 325 MG tablet Take 325 mg by mouth daily.    . fish oil-omega-3 fatty acids 1000 MG capsule Take 4 g by mouth daily.     [name] LEVEMIR FLEXTOUCH 100 UNIT/ML Pen Inject 30 Units into the skin daily.    . metFORMIN (GLUCOPHAGE) 500 MG tablet Take 500 mg by mouth 2 (two) times daily with a meal.    . metoprolol (LOPRESSOR) 50 MG tablet TAKE 1 TABLET BY  MOUTH TWICE A DAY 60 tablet 11  . Multiple Vitamin (MULTIVITAMIN) tablet Take 1 tablet by mouth daily.    . simvastatin (ZOCOR) 40 MG tablet Take 40 mg by mouth every evening.     No current facility-administered medications for this visit.    Allergies:   Review of patient's allergies indicates no known allergies.    Social History:  The patient  reports that he quit smoking about 26 years ago. He has never used smokeless tobacco. He reports that he drinks alcohol.   Family History:  The patient's family history includes COPD in his mother; Diabetes in his brother; Healthy in his father; Heart attack in his brother; Kidney disease in his mother.    ROS:  Please see the history of present illness.   Otherwise, review of systems are positive for depression. Stress of raising an autistic 32 year old son. His wife, mother, and several other close relatives have all died within the last 25 years..   All other systems are reviewed and negative.    PHYSICAL EXAM: VS:  BP 140/88 mmHg  Pulse 55  Ht 6\' 2"  (1.88 m)  Wt 82.01 kg (180 lb 12.8 oz)  BMI 23.20 kg/m2 , BMI Body mass index is 23.2 kg/(m^2). GEN: Well nourished, well developed, in no acute distress HEENT: normal Neck: no JVD, carotid bruits, or masses Cardiac: RRR.  There is no  murmur, rub, or gallop. There is no edema. Respiratory:  clear to auscultation bilaterally, normal work of breathing. GI: soft, nontender, nondistended, + BS MS: no deformity or atrophy Skin: warm and dry, no rash Neuro:  Strength and sensation are intact Psych: euthymic mood, full affect   EKG:  EKG reveals sinus bradycardia, with normal overall appearance.    Recent Labs: No results found for requested labs within last 365 days.    Lipid Panel    Component Value Date/Time   CHOL 160 03/09/2014 0901   TRIG 116.0 03/09/2014 0901   HDL 38.50* 03/09/2014 0901   CHOLHDL 4 03/09/2014 0901   VLDL 23.2 03/09/2014 0901   LDLCALC 98 03/09/2014 0901       Wt Readings from Last 3 Encounters:  08/26/15 82.01 kg (180 lb 12.8 oz)  07/07/14 85.276 kg (188 lb)  02/12/13 84.823 kg (187 lb)      Other studies Reviewed: Additional studies/ records that were reviewed today include: Reviewed and available laboratory data.. The findings include reviewed prior cath data. Problem list updated..    ASSESSMENT AND PLAN:  1. Essential hypertension Controlled  2. Diabetes mellitus, controlled Followed by primary  3. Coronary artery disease involving coronary bypass graft of native heart without angina pectoris Asymptomatic  4. Hyperlipidemia Not recently evaluated - Lipid panel - ALT    Current medicines are reviewed at length with the patient today.  The patient has the following concerns regarding medicines: None.  The following changes/actions have been instituted:    Continue aerobic activity  Non-fasting liver and lipid today  Notify if chest discomfort or dyspnea  Labs/ tests ordered today include:   Orders Placed This Encounter  Procedures  . Lipid panel  . ALT     Disposition:   FU with HS in 1 year  Signed, Sinclair Grooms, MD  08/26/2015 10:25 AM    [facility], [place], [place]  [address] Phone: [phone]; Fax: [phone]

## 2015-08-27 NOTE — Addendum Note (Signed)
Addended by: Freada Bergeron on: 08/27/2015 05:28 PM   Modules accepted: Orders

## 2015-08-31 ENCOUNTER — Telehealth: Payer: Self-pay

## 2015-08-31 DIAGNOSIS — E785 Hyperlipidemia, unspecified: Secondary | ICD-10-CM

## 2015-08-31 NOTE — Telephone Encounter (Signed)
Pt aware of lab results . Labs are at target.Repeat in one year Pt appreciative for the call and verbalized understanding.

## 2015-08-31 NOTE — Telephone Encounter (Signed)
-----   Message from Belva Crome, MD sent at 08/27/2015  6:11 PM EDT ----- Labs are at target.  Repeat in one year

## 2015-09-27 ENCOUNTER — Other Ambulatory Visit: Payer: Self-pay | Admitting: Interventional Cardiology

## 2016-08-24 ENCOUNTER — Encounter: Payer: Self-pay | Admitting: Nurse Practitioner

## 2016-10-17 ENCOUNTER — Encounter (INDEPENDENT_AMBULATORY_CARE_PROVIDER_SITE_OTHER): Payer: Self-pay

## 2016-10-17 ENCOUNTER — Telehealth: Payer: Self-pay | Admitting: *Deleted

## 2016-10-17 ENCOUNTER — Encounter: Payer: Self-pay | Admitting: Nurse Practitioner

## 2016-10-17 ENCOUNTER — Ambulatory Visit (INDEPENDENT_AMBULATORY_CARE_PROVIDER_SITE_OTHER): Payer: BLUE CROSS/BLUE SHIELD | Admitting: Nurse Practitioner

## 2016-10-17 VITALS — BP 160/90 | HR 64 | Ht 74.0 in | Wt 177.4 lb

## 2016-10-17 DIAGNOSIS — I10 Essential (primary) hypertension: Secondary | ICD-10-CM | POA: Diagnosis not present

## 2016-10-17 DIAGNOSIS — I25810 Atherosclerosis of coronary artery bypass graft(s) without angina pectoris: Secondary | ICD-10-CM

## 2016-10-17 DIAGNOSIS — E7800 Pure hypercholesterolemia, unspecified: Secondary | ICD-10-CM | POA: Diagnosis not present

## 2016-10-17 NOTE — Telephone Encounter (Signed)
S/w Lattie Haw @ [facility] @ [phone] and will fax pt's last lab results to office.

## 2016-10-17 NOTE — Progress Notes (Signed)
CARDIOLOGY OFFICE NOTE  Date:  10/17/2016    [name] Date of Birth: June 09, 1951 Medical Record [id number]  PCP:  [name], MD  Cardiologist:  [name]   Chief Complaint  Patient presents with  . Coronary Artery Disease    14 month check - seen for Dr. [name]    History of Present Illness: [name] is a 65 y.o. male who presents today for a 14 month check. Seen for Dr. [name].   He has a history of CAD, prior PTCA of the RCA during MI remote in 1998, subsequent RCA stent during the acute MI with DES placement in 2013, and staged PCI with stenting of the circumflex in 2013. Other issues include history of depression (due to multiple family member deaths), hypertension, diabetes, and hyperlipidemia. He cares for a son with autism.   Last seen back in September of 2016 - felt to be doing ok.   Comes in today. Here alone. He says he has been doing fine and feels really good. No chest pain. Not short of breath.. His hands hurt. He has dupuytren's contracture - planning on surgery next month at [facility].  Bp has been running lower at home. He remains pretty active. He notes that he is doing much better in regards to his wife's passing and taking care of his 42 year old son with autism. His son has around the clock supervision. Not sure if he needs medicine refills or not. Says he does not need a surgical clearance. Blood sugars have been running higher - especially since he had a steroid injection. He contnues to see Dr. Suzette Battiest who also checks all of his labs. He really has no concerns today and is happy with how he is doing.   Past Medical History:  Diagnosis Date  . Coronary artery disease CARDIOLOGIST- DR [name]  . Diabetes mellitus without complication ([place]) [id number]   oral,insulin management  . History of acute inferior wall myocardial infarction    03-01-2012  STEMI  . History of kidney stones   . History of MI (myocardial infarction) 1998    . S/P drug eluting  coronary stent placement   . Ureteral calculi left    Past Surgical History:  Procedure Laterality Date  . CORONARY ANGIOPLASTY WITH STENT PLACEMENT  1998  [facility])   [place]  . CORONARY ANGIOPLASTY WITH STENT PLACEMENT  03/03/2011   STENTING OF IN-STENT RESTENOSIS OF LEFT CIRDUMFLEX  (drug-eluting)     Medications: Current Outpatient Prescriptions  Medication Sig Dispense Refill  . aspirin 325 MG tablet Take 325 mg by mouth daily.    . fish oil-omega-3 fatty acids 1000 MG capsule Take 4 g by mouth daily.     [name] LEVEMIR FLEXTOUCH 100 UNIT/ML Pen Inject 30 Units into the skin daily.    . metFORMIN (GLUCOPHAGE) 500 MG tablet Take 500 mg by mouth 2 (two) times daily with a meal.    . metoprolol (LOPRESSOR) 50 MG tablet TAKE 1 TABLET BY MOUTH TWICE A DAY 60 tablet 11  . Multiple Vitamin (MULTIVITAMIN) tablet Take 1 tablet by mouth daily.    . simvastatin (ZOCOR) 40 MG tablet Take 40 mg by mouth every evening.     No current facility-administered medications for this visit.     Allergies: No Known Allergies  Social History: The patient  reports that he quit smoking about 27 years ago. He has never used smokeless tobacco. He reports that he drinks  alcohol.   Family History: The patient's family history includes COPD in his mother; Diabetes in his brother; Healthy in his father; Heart attack in his brother; Kidney disease in his mother.   Review of Systems: Please see the history of present illness.   Otherwise, the review of systems is positive for none.   All other systems are reviewed and negative.   Physical Exam: VS:  BP (!) 160/90 Comment: left/right160 90  Pulse 64   Ht 6\' 2"  (1.88 m)   Wt 177 lb 6.4 oz (80.5 kg)   BMI 22.78 kg/m  .  BMI Body mass index is 22.78 kg/m.  Wt Readings from Last 3 Encounters:  10/17/16 177 lb 6.4 oz (80.5 kg)  08/26/15 180 lb 12.8 oz (82 kg)  07/07/14 188 lb (85.3 kg)   BP recheck by me is 140/80 General:  Pleasant. Well developed, well nourished and in no acute distress.   HEENT: Normal.  Neck: Supple, no JVD, carotid bruits, or masses noted.  Cardiac: Regular rate and rhythm. No murmurs, rubs, or gallops. No edema.  Respiratory:  Lungs are clear to auscultation bilaterally with normal work of breathing.  GI: Soft and nontender.  MS: No deformity or atrophy. Gait and ROM intact.  Skin: Warm and dry. Color is normal.  Neuro:  Strength and sensation are intact and no gross focal deficits noted.  Psych: Alert, appropriate and with normal affect.   LABORATORY DATA:  EKG:  EKG is ordered today. This demonstrates NSR with persistent inferior changes but unchanged from tracing compared in July 2015.  Lab Results  Component Value Date   WBC 10.7 (H) 03/04/2011   HGB 14.1 03/04/2011   HCT 40.3 03/04/2011   PLT 171 03/04/2011   GLUCOSE 191 (H) 03/04/2011   CHOL 131 08/26/2015   TRIG 82.0 08/26/2015   HDL 41.40 08/26/2015   LDLCALC 73 08/26/2015   ALT 21 08/26/2015   AST 15 03/09/2014   NA 141 03/04/2011   K 4.5 03/04/2011   CL 108 03/04/2011   CREATININE 0.98 03/04/2011   BUN 9 03/04/2011   CO2 27 03/04/2011   INR 0.86 03/02/2011   HGBA1C (H) 03/04/2011    8.8 (NOTE)                                                                       According to the ADA Clinical Practice Recommendations for 2011, when HbA1c is used as a screening test:   >=6.5%   Diagnostic of Diabetes Mellitus           (if abnormal result  is confirmed)  5.7-6.4%   Increased risk of developing Diabetes Mellitus  References:Diagnosis and Classification of Diabetes Mellitus,Diabetes [id number] 1):S62-S69 and Standards of Medical Care in         Diabetes - 2011,Diabetes Care,2011,34  (Suppl 1):S11-S61.    BNP (last 3 results) No results for input(s): BNP in the last 8760 hours.  ProBNP (last 3 results) No results for input(s): PROBNP in the last 8760 hours.   Other Studies Reviewed  Today:   Assessment/Plan:  1. Essential hypertension - recheck by me is improved. He has great outpatient control.  I have asked him to continue to monitor.  2. Diabetes mellitus, controlled - followed by [name].   3. Coronary artery disease involving coronary bypass graft of native heart without angina pectoris - no symptoms. He is doing well clinically.   4. Hyperlipidemia - labs by Dr. Suzette Battiest - will try to obtain a copy.   5. Depression - says he is in a better place now and seems to be coping well.   Current medicines are reviewed with the patient today.  The patient does not have concerns regarding medicines other than what has been noted above.  The following changes have been made:  See above.  Labs/ tests ordered today include:   No orders of the defined types were placed in this encounter.    Disposition:   FU with Dr. [name] in one year.    Patient is agreeable to this plan and will call if any problems develop in the interim.   Signed: Burtis Junes, RN, ANP-C 10/17/2016 1:55 PM  [facility] [address] [place], [place]  [address] Phone: [phone] Fax: [phone]

## 2016-10-17 NOTE — Patient Instructions (Addendum)
We will be checking the following labs today - NONE  We will call [name] and get a copy of your most recent labs   Medication Instructions:    Continue with your current medicines.     Testing/Procedures To Be Arranged:  N/A  Follow-Up:   See Dr. [name] in one year.     Other Special Instructions:   Good luck with your hand surgery next month.     If you need a refill on your cardiac medications before your next appointment, please call your pharmacy.   Call the [place] office at [phone] if you have any questions, problems or concerns.

## 2016-10-17 NOTE — Telephone Encounter (Signed)
error 

## 2016-10-19 ENCOUNTER — Other Ambulatory Visit: Payer: Self-pay | Admitting: Interventional Cardiology

## 2017-10-31 ENCOUNTER — Encounter: Payer: Self-pay | Admitting: Interventional Cardiology

## 2017-11-11 NOTE — Progress Notes (Signed)
Cardiology Office Note    Date:  11/12/2017   ID:  [name], DOB 12-22-1950, MRN [id number]  PCP:  Dennis Kid, MD  Cardiologist: Dennis Grooms, MD   Chief Complaint  Patient presents with  . Coronary Artery Disease    History of Present Illness:  [name] is a 66 y.o. male has a history of CAD, prior PTCA of the RCA during MI remote in 1998, subsequent RCA stent during the acute MI with DES placement in 2013, and staged PCI with stenting of the circumflex in 2013. Other issues include history of depression (due to multiple family member deaths), hypertension, diabetes, and hyperlipidemia. He cares for a son with autism.    He is doing well. He sees Dr. Michiel Garcia for diabetes follow-up. No recent laboratory data.  He denies chest pain, excessive dyspnea, edema, orthopnea, PND, and palpitations. He has not needed nitroglycerin. He is staying relatively active.    Past Medical History:  Diagnosis Date  . Coronary artery disease CARDIOLOGIST- DR [name]  . Diabetes mellitus without complication ([place]) 5631   oral,insulin management  . History of acute inferior wall myocardial infarction    03-01-2012  STEMI  . History of kidney stones   . History of MI (myocardial infarction) 1998    . S/P drug eluting coronary stent placement   . Ureteral calculi left    Past Surgical History:  Procedure Laterality Date  . CORONARY ANGIOPLASTY WITH STENT PLACEMENT  1998  [facility])   [place]  . CORONARY ANGIOPLASTY WITH STENT PLACEMENT  03/03/2011   STENTING OF IN-STENT RESTENOSIS OF LEFT CIRDUMFLEX  (drug-eluting)    Current Medications: Outpatient Medications Prior to Visit  Medication Sig Dispense Refill  . fish oil-omega-3 fatty acids 1000 MG capsule Take 4 g by mouth daily.     [name] LEVEMIR FLEXTOUCH 100 UNIT/ML Pen Inject 45 Units into the skin daily.     . metFORMIN (GLUCOPHAGE) 500 MG tablet Take 500 mg by mouth 2 (two) times daily  with a meal.    . metoprolol (LOPRESSOR) 50 MG tablet TAKE 1 TABLET BY MOUTH TWICE A DAY 60 tablet 11  . Multiple Vitamin (MULTIVITAMIN) tablet Take 1 tablet by mouth daily.    . simvastatin (ZOCOR) 40 MG tablet Take 40 mg by mouth every evening.    [name] aspirin 325 MG tablet Take 325 mg by mouth daily.     No facility-administered medications prior to visit.      Allergies:   Patient has no known allergies.   Social History   Socioeconomic History  . Marital status: Married    Spouse name: None  . Number of children: None  . Years of education: None  . Highest education level: None  Social Needs  . Financial resource strain: None  . Food insecurity - worry: None  . Food insecurity - inability: None  . Transportation needs - medical: None  . Transportation needs - non-medical: None  Occupational History  . None  Tobacco Use  . Smoking status: Former Smoker    Last attempt to quit: 02/12/1989    Years since quitting: 28.7  . Smokeless tobacco: Never Used  Substance and Sexual Activity  . Alcohol use: Yes    Alcohol/week: 0.0 oz    Comment: rare  . Drug use: No  . Sexual activity: None  Other Topics Concern  . None  Social History Narrative  . None  Family History:  The patient's family history includes COPD in his mother; Diabetes in his brother; Healthy in his father; Heart attack in his brother; Kidney disease in his mother.   ROS:   Please see the history of present illness.    Frozen left shoulder. Dupuytren's contracture right hand and left hand. Right hand is status post surgery and it did not go well. Smokes 2 cigars per year. History of snoring. All other systems reviewed and are negative.   PHYSICAL EXAM:   VS:  BP 134/86   Pulse (!) 58   Ht 6\' 2"  (1.88 m)   Wt 182 lb 6.4 oz (82.7 kg)   BMI 23.42 kg/m    GEN: Well nourished, well developed, in no acute distress  HEENT: normal  Neck: no JVD, carotid bruits, or masses Cardiac: RRR; no murmurs, rubs,  or gallops,no edema  Respiratory:  clear to auscultation bilaterally, normal work of breathing GI: soft, nontender, nondistended, + BS MS: no deformity or atrophy  Skin: warm and dry, no rash Neuro:  Alert and Oriented x 3, Strength and sensation are intact Psych: euthymic mood, full affect  Wt Readings from Last 3 Encounters:  11/12/17 182 lb 6.4 oz (82.7 kg)  10/17/16 177 lb 6.4 oz (80.5 kg)  08/26/15 180 lb 12.8 oz (82 kg)      Studies/Labs Reviewed:   EKG:  EKG  sinus bradycardia, early repolarization, no evidence of infarction and no change when compared to prior.  Recent Labs: No results found for requested labs within last 8760 hours.   Lipid Panel    Component Value Date/Time   CHOL 131 08/26/2015 1027   TRIG 82.0 08/26/2015 1027   HDL 41.40 08/26/2015 1027   CHOLHDL 3 08/26/2015 1027   VLDL 16.4 08/26/2015 1027   LDLCALC 73 08/26/2015 1027    Additional studies/ records that were reviewed today include:  No new functional data    ASSESSMENT:    1. Coronary artery disease involving coronary bypass graft of native heart without angina pectoris   2. Essential hypertension   3. Pure hypercholesterolemia   4. Controlled type 2 diabetes mellitus with other circulatory complication, without long-term current use of insulin (HCC)      PLAN:  In order of problems listed above:  1. Decrease aspirin 81 mg per day. 2. Low-salt diet. Continue weight control. Target blood pressure 130/85 mmHg or less. 3. Target LDL less than 70. 4. Follow-up with primary care and diabetologist Dr. Reine Garcia  Clinical follow-up here in one year. Call of chest discomfort other clinical concerns.    Medication Adjustments/Labs and Tests Ordered: Current medicines are reviewed at length with the patient today.  Concerns regarding medicines are outlined above.  Medication changes, Labs and Tests ordered today are listed in the Patient Instructions below. Patient Instructions    Medication Instructions:  1) DECREASE Aspirin to 81mg  once daily  Labwork: None  Testing/Procedures: None  Follow-Up: Your physician wants you to follow-up in: 1 year with Dr. [name].  You will receive a reminder letter in the mail two months in advance. If you don't receive a letter, please call our office to schedule the follow-up appointment.   Any Other Special Instructions Will Be Listed Below (If Applicable).     If you need a refill on your cardiac medications before your next appointment, please call your pharmacy.      Signed, Dennis Grooms, MD  11/12/2017 10:21 AM    [facility] [place], [place], [place]  [address] Phone: [phone]; Fax: [phone]

## 2017-11-12 ENCOUNTER — Encounter: Payer: Self-pay | Admitting: Interventional Cardiology

## 2017-11-12 ENCOUNTER — Encounter (INDEPENDENT_AMBULATORY_CARE_PROVIDER_SITE_OTHER): Payer: Self-pay

## 2017-11-12 ENCOUNTER — Ambulatory Visit: Payer: BLUE CROSS/BLUE SHIELD | Admitting: Interventional Cardiology

## 2017-11-12 VITALS — BP 134/86 | HR 58 | Ht 74.0 in | Wt 182.4 lb

## 2017-11-12 DIAGNOSIS — E1159 Type 2 diabetes mellitus with other circulatory complications: Secondary | ICD-10-CM

## 2017-11-12 DIAGNOSIS — I10 Essential (primary) hypertension: Secondary | ICD-10-CM | POA: Diagnosis not present

## 2017-11-12 DIAGNOSIS — E7800 Pure hypercholesterolemia, unspecified: Secondary | ICD-10-CM | POA: Diagnosis not present

## 2017-11-12 DIAGNOSIS — I25810 Atherosclerosis of coronary artery bypass graft(s) without angina pectoris: Secondary | ICD-10-CM | POA: Diagnosis not present

## 2017-11-12 MED ORDER — ASPIRIN EC 81 MG PO TBEC: 81.0000 mg | DELAYED_RELEASE_TABLET | Freq: Every day | ORAL | 3 refills | Status: AC

## 2017-11-12 NOTE — Patient Instructions (Signed)
Medication Instructions:  1) DECREASE Aspirin to 81mg once daily  Labwork: None  Testing/Procedures: None  Follow-Up: Your physician wants you to follow-up in: 1 year with Dr. Smith.  You will receive a reminder letter in the mail two months in advance. If you don't receive a letter, please call our office to schedule the follow-up appointment.   Any Other Special Instructions Will Be Listed Below (If Applicable).     If you need a refill on your cardiac medications before your next appointment, please call your pharmacy.   

## 2017-11-15 ENCOUNTER — Other Ambulatory Visit: Payer: Self-pay | Admitting: Interventional Cardiology

## 2018-10-04 DIAGNOSIS — Z23 Encounter for immunization: Secondary | ICD-10-CM | POA: Diagnosis not present

## 2018-11-10 ENCOUNTER — Emergency Department (HOSPITAL_COMMUNITY): Payer: Medicare Other

## 2018-11-10 ENCOUNTER — Encounter (HOSPITAL_COMMUNITY): Payer: Self-pay | Admitting: Emergency Medicine

## 2018-11-10 ENCOUNTER — Inpatient Hospital Stay (HOSPITAL_COMMUNITY)
Admission: EM | Admit: 2018-11-10 | Discharge: 2018-11-12 | DRG: 247 | Disposition: A | Discharge status: Home / Self Care | Payer: Medicare Other | Attending: Cardiology | Admitting: Cardiology

## 2018-11-10 ENCOUNTER — Other Ambulatory Visit: Payer: Self-pay

## 2018-11-10 DIAGNOSIS — E119 Type 2 diabetes mellitus without complications: Secondary | ICD-10-CM | POA: Diagnosis not present

## 2018-11-10 DIAGNOSIS — R0789 Other chest pain: Secondary | ICD-10-CM | POA: Diagnosis not present

## 2018-11-10 DIAGNOSIS — Z87891 Personal history of nicotine dependence: Secondary | ICD-10-CM | POA: Diagnosis not present

## 2018-11-10 DIAGNOSIS — I25110 Atherosclerotic heart disease of native coronary artery with unstable angina pectoris: Principal | ICD-10-CM | POA: Diagnosis present

## 2018-11-10 DIAGNOSIS — Z7984 Long term (current) use of oral hypoglycemic drugs: Secondary | ICD-10-CM

## 2018-11-10 DIAGNOSIS — Z955 Presence of coronary angioplasty implant and graft: Secondary | ICD-10-CM

## 2018-11-10 DIAGNOSIS — I200 Unstable angina: Secondary | ICD-10-CM | POA: Diagnosis present

## 2018-11-10 DIAGNOSIS — R402140 Coma scale, eyes open, spontaneous, unspecified time: Secondary | ICD-10-CM | POA: Diagnosis not present

## 2018-11-10 DIAGNOSIS — I252 Old myocardial infarction: Secondary | ICD-10-CM | POA: Diagnosis not present

## 2018-11-10 DIAGNOSIS — I10 Essential (primary) hypertension: Secondary | ICD-10-CM | POA: Diagnosis present

## 2018-11-10 DIAGNOSIS — E785 Hyperlipidemia, unspecified: Secondary | ICD-10-CM | POA: Diagnosis present

## 2018-11-10 DIAGNOSIS — R402250 Coma scale, best verbal response, oriented, unspecified time: Secondary | ICD-10-CM | POA: Diagnosis not present

## 2018-11-10 DIAGNOSIS — Z79899 Other long term (current) drug therapy: Secondary | ICD-10-CM

## 2018-11-10 DIAGNOSIS — R402360 Coma scale, best motor response, obeys commands, unspecified time: Secondary | ICD-10-CM | POA: Diagnosis present

## 2018-11-10 DIAGNOSIS — I25119 Atherosclerotic heart disease of native coronary artery with unspecified angina pectoris: Secondary | ICD-10-CM | POA: Diagnosis not present

## 2018-11-10 DIAGNOSIS — E1165 Type 2 diabetes mellitus with hyperglycemia: Secondary | ICD-10-CM | POA: Diagnosis not present

## 2018-11-10 LAB — BASIC METABOLIC PANEL
Anion gap: 9 (ref 5–15)
BUN: 19 mg/dL (ref 8–23)
CO2: 24 mmol/L (ref 22–32)
Calcium: 9.2 mg/dL (ref 8.9–10.3)
Chloride: 103 mmol/L (ref 98–111)
Creatinine, Ser: 0.99 mg/dL (ref 0.61–1.24)
GFR calc Af Amer: >60 mL/min (ref >60)
GFR calc non Af Amer: >60 mL/min (ref >60)
Glucose, Bld: 210 mg/dL — ABNORMAL HIGH (ref 70–99)
Potassium: 4.3 mmol/L (ref 3.5–5.1)
Sodium: 136 mmol/L (ref 135–145)

## 2018-11-10 LAB — HEPARIN LEVEL (UNFRACTIONATED): Heparin Unfractionated: 0.13 IU/mL — ABNORMAL LOW (ref 0.30–0.70)

## 2018-11-10 LAB — CBC WITH DIFFERENTIAL/PLATELET
Abs Immature Granulocytes: 0.07 10*3/uL (ref 0.00–0.07)
Basophils Absolute: 0.1 10*3/uL (ref 0.0–0.1)
Basophils Relative: 1 %
Eosinophils Absolute: 0.3 10*3/uL (ref 0.0–0.5)
Eosinophils Relative: 3 %
HCT: 43.8 % (ref 39.0–52.0)
HEMOGLOBIN: 14.6 g/dL (ref 13.0–17.0)
Immature Granulocytes: 1 %
Lymphocytes Relative: 25 %
Lymphs Abs: 2.5 10*3/uL (ref 0.7–4.0)
MCH: 30.7 pg (ref 26.0–34.0)
MCHC: 33.3 g/dL (ref 30.0–36.0)
MCV: 92.2 fL (ref 80.0–100.0)
MONOS PCT: 10 %
Monocytes Absolute: 1 10*3/uL (ref 0.1–1.0)
Neutro Abs: 6.3 10*3/uL (ref 1.7–7.7)
Neutrophils Relative %: 60 %
Platelets: 177 10*3/uL (ref 150–400)
RBC: 4.75 MIL/uL (ref 4.22–5.81)
RDW: 12.3 % (ref 11.5–15.5)
WBC: 10.2 10*3/uL (ref 4.0–10.5)
nRBC: 0 % (ref 0.0–0.2)

## 2018-11-10 LAB — GLUCOSE, CAPILLARY
Glucose-Capillary: 73 mg/dL (ref 70–99)
Glucose-Capillary: 139 mg/dL — ABNORMAL HIGH (ref 70–99)

## 2018-11-10 LAB — I-STAT TROPONIN, ED: Troponin i, poc: 0 ng/mL (ref 0.00–0.08)

## 2018-11-10 LAB — APTT: aPTT: 59 seconds — ABNORMAL HIGH (ref 24–36)

## 2018-11-10 LAB — PROTIME-INR
INR: 0.98
Prothrombin Time: 12.9 seconds (ref 11.4–15.2)

## 2018-11-10 LAB — TROPONIN I
Troponin I: <0.03 ng/mL (ref <0.03)
Troponin I: <0.03 ng/mL (ref <0.03)

## 2018-11-10 MED ORDER — ASPIRIN 81 MG PO CHEW
81.0000 mg | CHEWABLE_TABLET | Freq: Every day | ORAL | Status: DC
  Administered 2018-11-12: 10:00:00 81 mg via ORAL
  Filled 2018-11-10: qty 1

## 2018-11-10 MED ORDER — NITROGLYCERIN IN D5W 200-5 MCG/ML-% IV SOLN
0.0000 ug/min | INTRAVENOUS | Status: DC
  Administered 2018-11-10: 5 ug/min via INTRAVENOUS
  Filled 2018-11-10: qty 250

## 2018-11-10 MED ORDER — ASPIRIN 300 MG RE SUPP: 300.0000 mg | RECTAL | Status: DC

## 2018-11-10 MED ORDER — ASPIRIN 81 MG PO CHEW
81.0000 mg | CHEWABLE_TABLET | ORAL | Status: AC
  Administered 2018-11-11: 81 mg via ORAL
  Filled 2018-11-10: qty 1

## 2018-11-10 MED ORDER — ASPIRIN 81 MG PO CHEW
81.0000 mg | CHEWABLE_TABLET | Freq: Once | ORAL | Status: AC
  Administered 2018-11-10: 81 mg via ORAL

## 2018-11-10 MED ORDER — SODIUM CHLORIDE 0.9% FLUSH: 3.0000 mL | INTRAVENOUS | Status: DC | PRN

## 2018-11-10 MED ORDER — ASPIRIN EC 81 MG PO TBEC: 81.0000 mg | DELAYED_RELEASE_TABLET | Freq: Every day | ORAL | Status: DC

## 2018-11-10 MED ORDER — ONDANSETRON HCL 4 MG/2ML IJ SOLN: 4.0000 mg | Freq: Four times a day (QID) | INTRAMUSCULAR | Status: DC | PRN

## 2018-11-10 MED ORDER — HEPARIN (PORCINE) 25000 UT/250ML-% IV SOLN
1200.0000 [IU]/h | INTRAVENOUS | Status: DC
  Administered 2018-11-10: 900 [IU]/h via INTRAVENOUS
  Filled 2018-11-10 (×2): qty 250

## 2018-11-10 MED ORDER — ATORVASTATIN CALCIUM 80 MG PO TABS
80.0000 mg | ORAL_TABLET | Freq: Every day | ORAL | Status: DC
  Administered 2018-11-10 – 2018-11-11 (×2): 80 mg via ORAL
  Filled 2018-11-10 (×3): qty 1

## 2018-11-10 MED ORDER — ASPIRIN 325 MG PO TABS: 325.0000 mg | ORAL_TABLET | Freq: Every day | ORAL | Status: DC

## 2018-11-10 MED ORDER — SODIUM CHLORIDE 0.9 % WEIGHT BASED INFUSION
1.0000 mL/kg/h | INTRAVENOUS | Status: DC
  Administered 2018-11-11: 1 mL/kg/h via INTRAVENOUS

## 2018-11-10 MED ORDER — INSULIN DETEMIR 100 UNIT/ML ~~LOC~~ SOLN
14.0000 [IU] | Freq: Every day | SUBCUTANEOUS | Status: DC
  Administered 2018-11-10 – 2018-11-11 (×2): 14 [IU] via SUBCUTANEOUS
  Filled 2018-11-10 (×3): qty 0.14

## 2018-11-10 MED ORDER — INSULIN DETEMIR 100 UNIT/ML ~~LOC~~ SOLN
9.0000 [IU] | Freq: Every day | SUBCUTANEOUS | Status: DC
  Administered 2018-11-12: 9 [IU] via SUBCUTANEOUS
  Filled 2018-11-10 (×2): qty 0.09

## 2018-11-10 MED ORDER — ACETAMINOPHEN 325 MG PO TABS: 650.0000 mg | ORAL_TABLET | ORAL | Status: DC | PRN

## 2018-11-10 MED ORDER — NITROGLYCERIN 0.4 MG SL SUBL: 0.4000 mg | SUBLINGUAL_TABLET | SUBLINGUAL | Status: DC | PRN

## 2018-11-10 MED ORDER — ASPIRIN 81 MG PO CHEW: 324.0000 mg | CHEWABLE_TABLET | ORAL | Status: DC

## 2018-11-10 MED ORDER — METOPROLOL TARTRATE 25 MG PO TABS
50.0000 mg | ORAL_TABLET | Freq: Two times a day (BID) | ORAL | Status: DC
  Administered 2018-11-10 – 2018-11-12 (×4): 50 mg via ORAL
  Filled 2018-11-10 (×2): qty 2
  Filled 2018-11-10 (×2): qty 1

## 2018-11-10 MED ORDER — SODIUM CHLORIDE 0.9 % IV SOLN: 250.0000 mL | INTRAVENOUS | Status: DC | PRN

## 2018-11-10 MED ORDER — SODIUM CHLORIDE 0.9% FLUSH
3.0000 mL | Freq: Two times a day (BID) | INTRAVENOUS | Status: DC
  Administered 2018-11-11: 3 mL via INTRAVENOUS

## 2018-11-10 MED ORDER — INSULIN DETEMIR 100 UNIT/ML FLEXPEN: 18.0000 [IU] | PEN_INJECTOR | Freq: Two times a day (BID) | SUBCUTANEOUS | Status: DC

## 2018-11-10 MED ORDER — ASPIRIN 81 MG PO CHEW
CHEWABLE_TABLET | ORAL | Status: AC
  Administered 2018-11-10: 81 mg via ORAL
  Filled 2018-11-10: qty 1

## 2018-11-10 MED ORDER — INSULIN ASPART 100 UNIT/ML ~~LOC~~ SOLN
0.0000 [IU] | Freq: Three times a day (TID) | SUBCUTANEOUS | Status: DC
  Administered 2018-11-11: 17:00:00 5 [IU] via SUBCUTANEOUS
  Administered 2018-11-11 – 2018-11-12 (×2): 2 [IU] via SUBCUTANEOUS

## 2018-11-10 MED ORDER — HEPARIN BOLUS VIA INFUSION
3500.0000 [IU] | Freq: Once | INTRAVENOUS | Status: AC
  Administered 2018-11-10: 3500 [IU] via INTRAVENOUS
  Filled 2018-11-10: qty 3500

## 2018-11-10 NOTE — ED Notes (Signed)
Consent for cath lab obtained

## 2018-11-10 NOTE — Progress Notes (Signed)
Pt received from ED. VSS. Telemetry applied. Pt reports no CP at this time. Pt oriented to room and unit. Will continue to monitor.  Clyde Canterbury, RN

## 2018-11-10 NOTE — Plan of Care (Signed)

## 2018-11-10 NOTE — H&P (Addendum)
Cardiology Admission History and Physical:   Patient ID: [name] MRN: [id number]; DOB: Sep 17, 1951   Admission date: 11/10/2018  Primary Care Provider: [name], MD Primary Cardiologist: Sinclair Grooms, MD  Primary Electrophysiologist:  None  Chief Complaint:  Angina  Patient Profile:   [name] is a 67 y.o. male with known CAD here with angina.   History of Present Illness:   [name] is a 67 year old male with coronary artery disease prior PTCA of the RCA in 1998 in the setting of myocardial infarction with subsequent RCA stent during acute MI with DES placement in 2012 as well as staged PCI with circumflex at that time with diabetes hypertension hyperlipidemia who cares for a son with autism here with anginal symptoms.  Over the past week or so he has been experiencing some left arm and left chest tightness worse with exertion improved with rest.  For instance it occurred when he was moving furniture.  He understands with his typical anginal symptoms are and these are quite typical for him.  He has been giving himself 1-2 nitroglycerin with relief.  He called office on Friday but it was closed.  He came in today because of some worsening discomfort, mostly left arm.  No associated fevers chills nausea vomiting shortness of breath.  Currently he is resting fairly comfortably in bed.  Wife at bedside.   Past Medical History:  Diagnosis Date  . Coronary artery disease CARDIOLOGIST- DR [name]  . Diabetes mellitus without complication ([place]) 6073   oral,insulin management  . History of acute inferior wall myocardial infarction    03-01-2012  STEMI  . History of kidney stones   . History of MI (myocardial infarction) 1998    . S/P drug eluting coronary stent placement   . Ureteral calculi left    Past Surgical History:  Procedure Laterality Date  . CORONARY ANGIOPLASTY WITH STENT PLACEMENT  1998  [facility])   [place]  .  CORONARY ANGIOPLASTY WITH STENT PLACEMENT  03/03/2011   STENTING OF IN-STENT RESTENOSIS OF LEFT CIRDUMFLEX  (drug-eluting)     Medications Prior to Admission: Prior to Admission medications   Medication Sig Start Date End Date Taking? Authorizing Provider  aspirin 325 MG tablet Take 325 mg by mouth daily.   Yes [provider]  calcium carbonate (TUMS EX) 750 MG chewable tablet Chew 2 tablets by mouth as needed for heartburn.   Yes [provider]  fish oil-omega-3 fatty acids 1000 MG capsule Take 4 g by mouth daily.    Yes [provider]  LEVEMIR FLEXTOUCH 100 UNIT/ML Pen Inject 18-28 Units into the skin 2 (two) times daily. Taking 18 units in the AM and 28 units at night. 08/02/15  Yes [provider]  metFORMIN (GLUCOPHAGE) 500 MG tablet Take 1,000 mg by mouth 2 (two) times daily with a meal.    Yes [provider]  metoprolol tartrate (LOPRESSOR) 50 MG tablet TAKE 1 TABLET BY MOUTH TWICE A DAY 11/15/17  Yes Belva Crome, MD  Multiple Vitamin (MULTIVITAMIN) tablet Take 1 tablet by mouth daily.   Yes [provider]  simvastatin (ZOCOR) 40 MG tablet Take 40 mg by mouth every evening.   Yes [provider]  Turmeric 500 MG CAPS Take 500 mg by mouth as needed (joint pain).   Yes [provider]  aspirin EC 81 MG tablet Take 1 tablet (81 mg total) by mouth daily. Patient not taking:  Reported on 11/10/2018 11/12/17   Belva Crome, MD     Allergies:   No Known Allergies  Social History:   Social History   Socioeconomic History  . Marital status: Married    Spouse name: Not on file  . Number of children: Not on file  . Years of education: Not on file  . Highest education level: Not on file  Occupational History  . Not on file  Social Needs  . Financial resource strain: Not on file  . Food insecurity:    Worry: Not on file    Inability: Not on file  . Transportation needs:    Medical: Not on file     Non-medical: Not on file  Tobacco Use  . Smoking status: Former Smoker    Last attempt to quit: 02/12/1989    Years since quitting: 29.7  . Smokeless tobacco: Never Used  Substance and Sexual Activity  . Alcohol use: Yes    Alcohol/week: 0.0 standard drinks    Comment: rare  . Drug use: No  . Sexual activity: Not on file  Lifestyle  . Physical activity:    Days per week: Not on file    Minutes per session: Not on file  . Stress: Not on file  Relationships  . Social connections:    Talks on phone: Not on file    Gets together: Not on file    Attends religious service: Not on file    Active member of club or organization: Not on file    Attends meetings of clubs or organizations: Not on file    Relationship status: Not on file  . Intimate partner violence:    Fear of current or ex partner: Not on file    Emotionally abused: Not on file    Physically abused: Not on file    Forced sexual activity: Not on file  Other Topics Concern  . Not on file  Social History Narrative  . Not on file    Family History:   The patient's family history includes COPD in his mother; Diabetes in his brother; Healthy in his father; Heart attack in his brother; Kidney disease in his mother.    ROS:  Please see the history of present illness.  All other ROS reviewed and negative.     Physical Exam/Data:   Vitals:   11/10/18 1107 11/10/18 1112 11/10/18 1117  BP:  (!) 189/86 (!) 161/90  Pulse:  (!) 56 (!) 53  Resp:  (!) 21 (!) 23  Temp:  97.6 F (36.4 C)   SpO2:  99% 97%  Weight: 77.4 kg    Height: 6\' 2"  (1.88 m)     No intake or output data in the 24 hours ending 11/10/18 1217 Filed Weights   11/10/18 1107  Weight: 77.4 kg   Body mass index is 21.9 kg/m.  General:  Well nourished, well developed, in no acute distress HEENT: normal Lymph: no adenopathy Neck: no JVD Endocrine:  No thryomegaly Vascular: No carotid bruits; FA pulses 2+ bilaterally without bruits  Cardiac:  normal S1,  S2; RRR; no murmur  Lungs:  clear to auscultation bilaterally, no wheezing, rhonchi or rales  Abd: soft, nontender, no hepatomegaly  Ext: no edema Musculoskeletal:  No deformities, BUE and BLE strength normal and equal Skin: warm and dry  Neuro:  CNs 2-12 intact, no focal abnormalities noted Psych:  Normal affect    EKG:  The ECG that was done was personally reviewed and  demonstrates normal sinus rhythm 69 with artifact in the inferior leads otherwise no significant ischemic changes noted.  Prior EKG from 2018 shows early repolarization.  Relevant CV Studies: No recent echocardiogram or cardiac catheterization.    Laboratory Data:  Chemistry Recent Labs  Lab 11/10/18 1115  NA 136  K 4.3  CL 103  CO2 24  GLUCOSE 210*  BUN 19  CREATININE 0.99  CALCIUM 9.2  GFRNONAA >60  GFRAA >60  ANIONGAP 9    No results for input(s): PROT, ALBUMIN, AST, ALT, ALKPHOS, BILITOT in the last 168 hours. Hematology Recent Labs  Lab 11/10/18 1115  WBC 10.2  RBC 4.75  HGB 14.6  HCT 43.8  MCV 92.2  MCH 30.7  MCHC 33.3  RDW 12.3  PLT 177   Cardiac EnzymesNo results for input(s): TROPONINI in the last 168 hours.  Recent Labs  Lab 11/10/18 1118  TROPIPOC 0.00    BNPNo results for input(s): BNP, PROBNP in the last 168 hours.  DDimer No results for input(s): DDIMER in the last 168 hours.  Radiology/Studies:  Dg Chest Port 1 View  Result Date: 11/10/2018 CLINICAL DATA:  Chest tightness and tension in left arm today. EXAM: PORTABLE CHEST 1 VIEW COMPARISON:  Single-view of the chest 03/02/2011. FINDINGS: The lungs are clear. Heart size is normal. Aortic atherosclerosis is noted. No pneumothorax or pleural effusion. No acute or focal bony abnormality. IMPRESSION: No acute disease. Atherosclerosis. Electronically Signed   By: Inge Rise M.D.   On: 11/10/2018 12:05    Assessment and Plan:   Unstable angina - IV heparin, beta-blockers tolerated, aspirin, statin, nitrate - Cardiac  catheterization tomorrow given his prior RCA and circumflex history.  (Single long 33 mm Xience DES in circumflex, 3.5 x 20 mm Promus DES in right coronary artery) -Risks and benefits of procedure have been explained including stroke heart attack death renal impairment bleeding he is willing to proceed.  Diabetes with hypertension -Sees Dr. Soyla Murphy.  Overall well controlled diabetes.  Continue work on hypertension, likely an anxiety component as well here in the ER. -Hold metformin.  Continue with Levemir current home dosing. hemoglobin A1c has been less than 7.   Hyperlipidemia -Continue with statin use, however change from Zocor 40 to atorvastatin 80 mg once a day.  Severity of Illness: The appropriate patient status for this patient is INPATIENT. Inpatient status is judged to be reasonable and necessary in order to provide the required intensity of service to ensure the patient's safety. The patient's presenting symptoms, physical exam findings, and initial radiographic and laboratory data in the context of their chronic comorbidities is felt to place them at high risk for further clinical deterioration. Furthermore, it is not anticipated that the patient will be medically stable for discharge from the hospital within 2 midnights of admission. The following factors support the patient status of inpatient.   " The patient's presenting symptoms include angina. " The worrisome physical exam findings include angina. " The initial radiographic and laboratory data are worrisome because of possible blocked artery. " The chronic co-morbidities include known CAD, hypertension.   * I certify that at the point of admission it is my clinical judgment that the patient will require inpatient hospital care spanning beyond 2 midnights from the point of admission due to high intensity of service, high risk for further deterioration and high frequency of surveillance required.*    For questions or updates,  please contact [place] Please consult [email or web address] for contact info under  Signed, Candee Furbish, MD  11/10/2018 12:17 PM

## 2018-11-10 NOTE — ED Provider Notes (Addendum)
[facility] EMERGENCY DEPARTMENT Provider Note   CSN: [id number] Arrival date & time: 11/10/18  1102     History   Chief Complaint Chief Complaint  Patient presents with  . Chest Pain    HPI [name] is a 67 y.o. male.  Patient has had intermittent chest pain described as tightness rating to his left arm for the past week.  He noticed it was worse when he moved furniture, improved with rest.  He feels that this is his typical angina.  Last had angina in 2012.  He is been treating himself with 1 or 2 sublingual nitroglycerin with each episode with complete relief.  He administered himself aspirin 243 mg this morning. presently complains of mild left arm discomfort no chest pain.  No associated shortness of breath or nausea.  HPI  Past Medical History:  Diagnosis Date  . Coronary artery disease CARDIOLOGIST- DR [name]  . Diabetes mellitus without complication ([place]) 6387   oral,insulin management  . History of acute inferior wall myocardial infarction    03-01-2012  STEMI  . History of kidney stones   . History of MI (myocardial infarction) 1998    . S/P drug eluting coronary stent placement   . Ureteral calculi left    Patient Active Problem List   Diagnosis Date Noted  . Hyperlipidemia 08/26/2015  . Coronary artery disease involving coronary bypass graft of native heart without angina pectoris 07/09/2014  . Essential hypertension 07/09/2014  . Diabetes mellitus, controlled ([place]) 07/09/2014    Past Surgical History:  Procedure Laterality Date  . CORONARY ANGIOPLASTY WITH STENT PLACEMENT  1998  [facility])   [place]  . CORONARY ANGIOPLASTY WITH STENT PLACEMENT  03/03/2011   STENTING OF IN-STENT RESTENOSIS OF LEFT CIRDUMFLEX  (drug-eluting)        Home Medications    Prior to Admission medications   Medication Sig Start Date End Date Taking? Authorizing Provider  aspirin EC 81 MG tablet Take 1 tablet (81 mg  total) by mouth daily. 11/12/17   Belva Crome, MD  fish oil-omega-3 fatty acids 1000 MG capsule Take 4 g by mouth daily.     [provider]  LEVEMIR FLEXTOUCH 100 UNIT/ML Pen Inject 45 Units into the skin daily.  08/02/15   [provider]  metFORMIN (GLUCOPHAGE) 500 MG tablet Take 500 mg by mouth 2 (two) times daily with a meal.    [provider]  metoprolol tartrate (LOPRESSOR) 50 MG tablet TAKE 1 TABLET BY MOUTH TWICE A DAY 11/15/17   Belva Crome, MD  Multiple Vitamin (MULTIVITAMIN) tablet Take 1 tablet by mouth daily.    [provider]  simvastatin (ZOCOR) 40 MG tablet Take 40 mg by mouth every evening.    [provider]    Family History Family History  Problem Relation Age of Onset  . COPD Mother   . Kidney disease Mother   . Healthy Father   . Diabetes Brother   . Heart attack Brother     Social History Social History   Tobacco Use  . Smoking status: Former Smoker    Last attempt to quit: 02/12/1989    Years since quitting: 29.7  . Smokeless tobacco: Never Used  Substance Use Topics  . Alcohol use: Yes    Alcohol/week: 0.0 standard drinks    Comment: rare  . Drug use: No    Quit smoking 2014 Allergies   Patient has no known allergies.  Review of Systems Review of Systems  Constitutional: Negative.   HENT: Negative.   Respiratory: Negative.   Cardiovascular: Positive for chest pain.  Gastrointestinal: Negative.   Musculoskeletal: Negative.   Skin: Negative.   Neurological: Negative.   Psychiatric/Behavioral: Negative.   All other systems reviewed and are negative.    Physical Exam Updated Vital Signs BP (!) 189/86 (BP Location: Right Arm)   Pulse (!) 56   Temp 97.6 F (36.4 C)   Resp (!) 21   Ht 6\' 2"  (1.88 m)   Wt 77.4 kg   SpO2 99%   BMI 21.90 kg/m   Physical Exam  Constitutional: He appears well-developed and well-nourished.  HENT:  Head: Normocephalic and atraumatic.  Eyes: Pupils are  equal, round, and reactive to light. Conjunctivae are normal.  Neck: Neck supple. No tracheal deviation present. No thyromegaly present.  Cardiovascular: Regular rhythm.  No murmur heard. Mildly bradycardic  Pulmonary/Chest: Effort normal and breath sounds normal.  Abdominal: Soft. Bowel sounds are normal. He exhibits no distension. There is no tenderness.  Musculoskeletal: Normal range of motion. He exhibits no edema or tenderness.  Neurological: He is alert. Coordination normal.  Skin: Skin is warm and dry. No rash noted.  Psychiatric: He has a normal mood and affect.  Nursing note and vitals reviewed.    ED Treatments / Results  Labs (all labs ordered are listed, but only abnormal results are displayed) Labs Reviewed  CBC WITH DIFFERENTIAL/PLATELET  BASIC METABOLIC PANEL  I-STAT TROPONIN, ED    EKG EKG Interpretation  Date/Time:  Sunday November 10 2018 11:10:09 EST Ventricular Rate:  60 PR Interval:    QRS Duration: 83 QT Interval:  392 QTC Calculation: 392 R Axis:   78 Text Interpretation:  Sinus rhythm Atrial premature complex Minimal ST elevation, inferior leads No significant change since last tracing Confirmed by Macai Sisneros ([id number]) on 11/10/2018 11:18:19 AM Nonspecific inferior wall T wave changes seen on previous tracing have resolved  Radiology No results found.  Procedures Procedures (including critical care time)  Medications Ordered in ED Medications  nitroGLYCERIN 50 mg in dextrose 5 % 250 mL (0.2 mg/mL) infusion (has no administration in time range)  aspirin chewable tablet 81 mg (has no administration in time range)  aspirin 81 MG chewable tablet (81 mg  Given 11/10/18 1114)    Chest x-ray viewed by me Lab work remarkable for hyperglycemia otherwise normal Initial Impression / Assessment and Plan / ED Course  I have reviewed the triage vital signs and the nursing notes.  Pertinent labs & imaging results that were available during my care of  the patient were reviewed by me and considered in my medical decision making (see chart for details).     12 :40 PM patient asymptomatic after treatment with intravenous nitroglycerin drip, intravenous heparin drip and aspirin. I consulted [facility] cardiology service to arrange for admission and patient will have cardiac catheterization tomorrow Final Clinical Impressions(s) / ED Diagnoses  Diagnosis #1 unstable angina #2 hyperglycemia Final diagnoses:  None   CRITICAL CARE Performed by: Orlie Dakin Total critical care time: 30 minutes Critical care time was exclusive of separately billable procedures and treating other patients. Critical care was necessary to treat or prevent imminent or life-threatening deterioration. Critical care was time spent personally by me on the following activities: development of treatment plan with patient and/or surrogate as well as nursing, discussions with consultants, evaluation of patient's response to treatment, examination of patient, obtaining history from patient or surrogate, ordering  and performing treatments and interventions, ordering and review of laboratory studies, ordering and review of radiographic studies, pulse oximetry and re-evaluation of patient's condition. ED Discharge Orders    None       Orlie Dakin, MD 11/10/18 1250    Orlie Dakin, MD 11/10/18 1251

## 2018-11-10 NOTE — Progress Notes (Signed)
ANTICOAGULATION CONSULT NOTE - Initial Consult  Pharmacy Consult:  Heparin Indication: chest pain/ACS  No Known Allergies  Patient Measurements: Height: 6\' 2"  (188 cm) Weight: 170 lb 9.6 oz (77.4 kg) IBW/kg (Calculated) : 82.2 Heparin Dosing Weight: 77 kg  Vital Signs: Temp: 97.6 F (36.4 C) (12/01 1112) BP: 161/90 (12/01 1117) Pulse Rate: 53 (12/01 1117)  Labs: No results for input(s): HGB, HCT, PLT, APTT, LABPROT, INR, HEPARINUNFRC, HEPRLOWMOCWT, CREATININE, CKTOTAL, CKMB, TROPONINI in the last 72 hours.  CrCl cannot be calculated (Patient's most recent lab result is older than the maximum 21 days allowed.).   Medical History: Past Medical History:  Diagnosis Date  . Coronary artery disease CARDIOLOGIST- DR [name]  . Diabetes mellitus without complication ([place]) 4235   oral,insulin management  . History of acute inferior wall myocardial infarction    03-01-2012  STEMI  . History of kidney stones   . History of MI (myocardial infarction) 1998    . S/P drug eluting coronary stent placement   . Ureteral calculi left     Assessment: 89 YOM presented with chest tightness and Pharmacy consulted to initiate IV heparin for ACS.  Baseline labs in process.  Goal of Therapy:  Heparin level 0.3-0.7 units/ml Monitor platelets by anticoagulation protocol: Yes   Plan:  Heparin 3500 units IV bolus, then Heparin gtt at 900 units/hr Check 6 hr heparin level Daily heparin level and CBC   Jonerik Sliker D. Mina Marble, PharmD, BCPS, [place] 11/10/2018, 11:25 AM

## 2018-11-10 NOTE — ED Triage Notes (Signed)
Pt reports tightness in chest and tension in left arm. States the pain started one week ago and has gotten worse. Reports the same feeling when he has needed stent in 1998 and 2012. States he has tried nitro and has gotten relief each time.

## 2018-11-11 ENCOUNTER — Ambulatory Visit (HOSPITAL_COMMUNITY): Payer: Medicare Other

## 2018-11-11 ENCOUNTER — Encounter (HOSPITAL_COMMUNITY): Payer: Self-pay

## 2018-11-11 ENCOUNTER — Encounter (HOSPITAL_COMMUNITY)
Admission: EM | Disposition: A | Discharge status: Home / Self Care | Payer: Self-pay | Source: Home / Self Care | Attending: Cardiology

## 2018-11-11 DIAGNOSIS — I200 Unstable angina: Secondary | ICD-10-CM

## 2018-11-11 DIAGNOSIS — I25119 Atherosclerotic heart disease of native coronary artery with unspecified angina pectoris: Secondary | ICD-10-CM

## 2018-11-11 HISTORY — PX: LEFT HEART CATH AND CORONARY ANGIOGRAPHY: CATH118249

## 2018-11-11 HISTORY — PX: CORONARY STENT INTERVENTION: CATH118234

## 2018-11-11 LAB — BASIC METABOLIC PANEL
Anion gap: 8 (ref 5–15)
BUN: 17 mg/dL (ref 8–23)
CO2: 23 mmol/L (ref 22–32)
Calcium: 8.5 mg/dL — ABNORMAL LOW (ref 8.9–10.3)
Chloride: 100 mmol/L (ref 98–111)
Creatinine, Ser: 1.03 mg/dL (ref 0.61–1.24)
GFR calc Af Amer: >60 mL/min (ref >60)
GFR calc non Af Amer: >60 mL/min (ref >60)
Glucose, Bld: 203 mg/dL — ABNORMAL HIGH (ref 70–99)
Potassium: 3.9 mmol/L (ref 3.5–5.1)
Sodium: 131 mmol/L — ABNORMAL LOW (ref 135–145)

## 2018-11-11 LAB — LIPID PANEL
Cholesterol: 112 mg/dL (ref 0–200)
HDL: 29 mg/dL — AB (ref >40)
LDL Cholesterol: 30 mg/dL (ref 0–99)
Total CHOL/HDL Ratio: 3.9 RATIO
Triglycerides: 266 mg/dL — ABNORMAL HIGH (ref <150)
VLDL: 53 mg/dL — AB (ref 0–40)

## 2018-11-11 LAB — CBC
HCT: 38.9 % — ABNORMAL LOW (ref 39.0–52.0)
Hemoglobin: 12.6 g/dL — ABNORMAL LOW (ref 13.0–17.0)
MCH: 29.6 pg (ref 26.0–34.0)
MCHC: 32.4 g/dL (ref 30.0–36.0)
MCV: 91.3 fL (ref 80.0–100.0)
NRBC: 0 % (ref 0.0–0.2)
Platelets: 158 10*3/uL (ref 150–400)
RBC: 4.26 MIL/uL (ref 4.22–5.81)
RDW: 12.3 % (ref 11.5–15.5)
WBC: 10.8 10*3/uL — ABNORMAL HIGH (ref 4.0–10.5)

## 2018-11-11 LAB — GLUCOSE, CAPILLARY
GLUCOSE-CAPILLARY: 143 mg/dL — AB (ref 70–99)
Glucose-Capillary: 119 mg/dL — ABNORMAL HIGH (ref 70–99)
Glucose-Capillary: 201 mg/dL — ABNORMAL HIGH (ref 70–99)
Glucose-Capillary: 139 mg/dL — ABNORMAL HIGH (ref 70–99)

## 2018-11-11 LAB — HIV ANTIBODY (ROUTINE TESTING W REFLEX): HIV Screen 4th Generation wRfx: NONREACTIVE

## 2018-11-11 LAB — POCT ACTIVATED CLOTTING TIME: Activated Clotting Time: 323 seconds

## 2018-11-11 LAB — TROPONIN I: Troponin I: <0.03 ng/mL (ref <0.03)

## 2018-11-11 LAB — HEPARIN LEVEL (UNFRACTIONATED)
Heparin Unfractionated: <0.1 IU/mL — ABNORMAL LOW (ref 0.30–0.70)
Heparin Unfractionated: 0.37 IU/mL (ref 0.30–0.70)

## 2018-11-11 LAB — ECHOCARDIOGRAM COMPLETE
Height: 74 in
Weight: 2729.6 oz

## 2018-11-11 SURGERY — LEFT HEART CATH AND CORONARY ANGIOGRAPHY
Anesthesia: LOCAL

## 2018-11-11 MED ORDER — LIDOCAINE HCL (PF) 1 % IJ SOLN
INTRAMUSCULAR | Status: AC
  Filled 2018-11-11: qty 30

## 2018-11-11 MED ORDER — VERAPAMIL HCL 2.5 MG/ML IV SOLN
INTRAVENOUS | Status: DC | PRN
  Administered 2018-11-11: 11:00:00 via INTRA_ARTERIAL

## 2018-11-11 MED ORDER — LOSARTAN POTASSIUM 50 MG PO TABS
25.0000 mg | ORAL_TABLET | Freq: Every day | ORAL | Status: DC
  Administered 2018-11-11: 22:00:00 25 mg via ORAL
  Filled 2018-11-11: qty 1

## 2018-11-11 MED ORDER — MIDAZOLAM HCL 2 MG/2ML IJ SOLN
INTRAMUSCULAR | Status: DC | PRN
  Administered 2018-11-11: 1 mg via INTRAVENOUS
  Administered 2018-11-11 (×2): 0.5 mg via INTRAVENOUS

## 2018-11-11 MED ORDER — VERAPAMIL HCL 2.5 MG/ML IV SOLN
INTRAVENOUS | Status: AC
  Filled 2018-11-11: qty 2

## 2018-11-11 MED ORDER — ANGIOPLASTY BOOK
Freq: Once | Status: DC
  Filled 2018-11-11: qty 1

## 2018-11-11 MED ORDER — HEPARIN SODIUM (PORCINE) 1000 UNIT/ML IJ SOLN
INTRAMUSCULAR | Status: AC
  Filled 2018-11-11: qty 1

## 2018-11-11 MED ORDER — NITROGLYCERIN 1 MG/10 ML FOR IR/CATH LAB
INTRA_ARTERIAL | Status: DC | PRN
  Administered 2018-11-11 (×2): 200 ug via INTRACORONARY

## 2018-11-11 MED ORDER — LIDOCAINE HCL (PF) 1 % IJ SOLN
INTRAMUSCULAR | Status: DC | PRN
  Administered 2018-11-11: 5 mL

## 2018-11-11 MED ORDER — HEPARIN SODIUM (PORCINE) 5000 UNIT/ML IJ SOLN
5000.0000 [IU] | Freq: Three times a day (TID) | INTRAMUSCULAR | Status: DC
  Administered 2018-11-11 – 2018-11-12 (×2): 5000 [IU] via SUBCUTANEOUS
  Filled 2018-11-11 (×2): qty 1

## 2018-11-11 MED ORDER — HEPARIN (PORCINE) IN NACL 1000-0.9 UT/500ML-% IV SOLN
INTRAVENOUS | Status: AC
  Filled 2018-11-11: qty 500

## 2018-11-11 MED ORDER — ACETAMINOPHEN 325 MG PO TABS: 650.0000 mg | ORAL_TABLET | ORAL | Status: DC | PRN

## 2018-11-11 MED ORDER — IOHEXOL 350 MG/ML SOLN
INTRAVENOUS | Status: DC | PRN
  Administered 2018-11-11: 180 mL via INTRA_ARTERIAL

## 2018-11-11 MED ORDER — HEPARIN BOLUS VIA INFUSION
2000.0000 [IU] | Freq: Once | INTRAVENOUS | Status: AC
  Administered 2018-11-11: 2000 [IU] via INTRAVENOUS
  Filled 2018-11-11: qty 2000

## 2018-11-11 MED ORDER — NITROGLYCERIN 1 MG/10 ML FOR IR/CATH LAB
INTRA_ARTERIAL | Status: AC
  Filled 2018-11-11: qty 10

## 2018-11-11 MED ORDER — SODIUM CHLORIDE 0.9 % IV SOLN: 250.0000 mL | INTRAVENOUS | Status: DC | PRN

## 2018-11-11 MED ORDER — LABETALOL HCL 5 MG/ML IV SOLN: 10.0000 mg | INTRAVENOUS | Status: AC | PRN

## 2018-11-11 MED ORDER — SODIUM CHLORIDE 0.9% FLUSH: 3.0000 mL | Freq: Two times a day (BID) | INTRAVENOUS | Status: DC

## 2018-11-11 MED ORDER — HEPARIN (PORCINE) IN NACL 1000-0.9 UT/500ML-% IV SOLN
INTRAVENOUS | Status: DC | PRN
  Administered 2018-11-11 (×2): 500 mL

## 2018-11-11 MED ORDER — MIDAZOLAM HCL 2 MG/2ML IJ SOLN
INTRAMUSCULAR | Status: AC
  Filled 2018-11-11: qty 2

## 2018-11-11 MED ORDER — CLOPIDOGREL BISULFATE 300 MG PO TABS
ORAL_TABLET | ORAL | Status: DC | PRN
  Administered 2018-11-11: 600 mg via ORAL

## 2018-11-11 MED ORDER — THE SENSUOUS HEART BOOK
Freq: Once | Status: AC
  Administered 2018-11-11: 22:00:00
  Filled 2018-11-11: qty 1

## 2018-11-11 MED ORDER — FENTANYL CITRATE (PF) 100 MCG/2ML IJ SOLN
INTRAMUSCULAR | Status: AC
  Filled 2018-11-11: qty 2

## 2018-11-11 MED ORDER — CLOPIDOGREL BISULFATE 75 MG PO TABS
75.0000 mg | ORAL_TABLET | Freq: Every day | ORAL | Status: DC
  Administered 2018-11-12: 10:00:00 75 mg via ORAL
  Filled 2018-11-11: qty 1

## 2018-11-11 MED ORDER — HEPARIN SODIUM (PORCINE) 1000 UNIT/ML IJ SOLN
INTRAMUSCULAR | Status: DC | PRN
  Administered 2018-11-11: 6000 [IU] via INTRAVENOUS
  Administered 2018-11-11: 4000 [IU] via INTRAVENOUS

## 2018-11-11 MED ORDER — SODIUM CHLORIDE 0.9 % IV SOLN
INTRAVENOUS | Status: AC
  Administered 2018-11-11: 13:00:00 via INTRAVENOUS

## 2018-11-11 MED ORDER — HYDRALAZINE HCL 20 MG/ML IJ SOLN: 5.0000 mg | INTRAMUSCULAR | Status: AC | PRN

## 2018-11-11 MED ORDER — ONDANSETRON HCL 4 MG/2ML IJ SOLN: 4.0000 mg | Freq: Four times a day (QID) | INTRAMUSCULAR | Status: DC | PRN

## 2018-11-11 MED ORDER — SODIUM CHLORIDE 0.9% FLUSH: 3.0000 mL | INTRAVENOUS | Status: DC | PRN

## 2018-11-11 MED ORDER — ASPIRIN 81 MG PO CHEW: 81.0000 mg | CHEWABLE_TABLET | Freq: Every day | ORAL | Status: DC

## 2018-11-11 MED ORDER — FENTANYL CITRATE (PF) 100 MCG/2ML IJ SOLN
INTRAMUSCULAR | Status: DC | PRN
  Administered 2018-11-11 (×2): 25 ug via INTRAVENOUS

## 2018-11-11 MED ORDER — CLOPIDOGREL BISULFATE 300 MG PO TABS
ORAL_TABLET | ORAL | Status: AC
  Filled 2018-11-11: qty 2

## 2018-11-11 SURGICAL SUPPLY — 17 items
BALLN SAPPHIRE 2.0X12 (BALLOONS) ×2
BALLN SAPPHIRE ~~LOC~~ 2.25X10 (BALLOONS) ×2 IMPLANT
BALLOON SAPPHIRE 2.0X12 (BALLOONS) ×1 IMPLANT
CATH 5FR JL3.5 JR4 ANG PIG MP (CATHETERS) ×2 IMPLANT
CATH VISTA GUIDE 6FR XBLAD3.5 (CATHETERS) ×2 IMPLANT
DEVICE RAD COMP TR BAND LRG (VASCULAR PRODUCTS) ×2 IMPLANT
GLIDESHEATH SLEND A-KIT 6F 22G (SHEATH) ×2 IMPLANT
GUIDEWIRE INQWIRE 1.5J.035X260 (WIRE) ×1 IMPLANT
INQWIRE 1.5J .035X260CM (WIRE) ×2
KIT ENCORE 26 ADVANTAGE (KITS) ×2 IMPLANT
KIT HEART LEFT (KITS) ×2 IMPLANT
PACK CARDIAC CATHETERIZATION (CUSTOM PROCEDURE TRAY) ×2 IMPLANT
SHEATH PROBE COVER 6X72 (BAG) ×2 IMPLANT
STENT RESOLUTE ONYX 2.0X18 (Permanent Stent) ×2 IMPLANT
TRANSDUCER W/STOPCOCK (MISCELLANEOUS) ×2 IMPLANT
TUBING CIL FLEX 10 FLL-RA (TUBING) ×2 IMPLANT
WIRE MINAMO 190 (WIRE) ×2 IMPLANT

## 2018-11-11 NOTE — Progress Notes (Signed)
  Echocardiogram 2D Echocardiogram has been performed.  [name] 11/11/2018, 4:50 PM

## 2018-11-11 NOTE — Progress Notes (Signed)
Nutrition Brief Note  Patient identified on the Malnutrition Screening Tool (MST) Report  Wt Readings from Last 15 Encounters:  11/11/18 77.4 kg  11/12/17 82.7 kg  10/17/16 80.5 kg  08/26/15 82 kg  07/07/14 85.3 kg    Body mass index is 21.9 kg/m. Patient meets criteria for normal based on current BMI.   Current diet order is Heart Healthy/Carb modified, patient is consuming approximately 100% of meals at this time. Labs and medications reviewed.   No nutrition interventions warranted at this time. If nutrition issues arise, please consult RD.   Althea Grimmer, MS, RDN, LDN Pager: [phone]

## 2018-11-11 NOTE — Progress Notes (Signed)
ANTICOAGULATION CONSULT NOTE - Initial Consult  Pharmacy Consult:  Heparin Indication: chest pain/ACS  No Known Allergies  Patient Measurements: Height: 6\' 2"  (188 cm) Weight: 170 lb 9.6 oz (77.4 kg) IBW/kg (Calculated) : 82.2 Heparin Dosing Weight: 77 kg  Vital Signs: Temp: 97.2 F (36.2 C) (12/02 0503) Temp Source: Oral (12/02 0503) BP: 170/94 (12/02 0600) Pulse Rate: 59 (12/02 0503)  Labs: Recent Labs    11/10/18 1115 11/10/18 1508 11/10/18 1820 11/11/18 0021 11/11/18 0650  HGB 14.6  --   --  12.6*  --   HCT 43.8  --   --  38.9*  --   PLT 177  --   --  158  --   APTT  --  59*  --   --   --   LABPROT  --  12.9  --   --   --   INR  --  0.98  --   --   --   HEPARINUNFRC  --   --  0.13* <0.10* 0.37  CREATININE 0.99  --   --  1.03  --   TROPONINI  --  <0.03 <0.03 <0.03  --     Estimated Creatinine Clearance: 78.3 mL/min (by C-G formula based on SCr of 1.03 mg/dL).   Medical History: Past Medical History:  Diagnosis Date  . Coronary artery disease CARDIOLOGIST- DR [name]  . Diabetes mellitus without complication ([place]) 9833   oral,insulin management  . History of acute inferior wall myocardial infarction    03-01-2012  STEMI  . History of kidney stones   . History of MI (myocardial infarction) 1998    . S/P drug eluting coronary stent placement   . Ureteral calculi left     Assessment: [name] presented with chest tightness and Pharmacy consulted to initiate IV heparin for ACS.   Heparin level now therapeutic at 0.37 s/p re-bolus and gtt increase to 1200 units/hr.  H/H drop, no bleeding noted.  For cath today.  Goal of Therapy:  Heparin level 0.3-0.7 units/ml Monitor platelets by anticoagulation protocol: Yes   Plan:  Continue heparin gtt at 1200 units/hr F/u cath Daily heparin level, CBC, s/s bleeding  Bertis Ruddy, PharmD Clinical Pharmacist Please check AMION for all [place] numbers 11/11/2018 8:59 AM

## 2018-11-11 NOTE — Progress Notes (Signed)
Interval note:  CA performed today:  Progressive angina over the past 2 to 4 weeks prior to admission.  Negative cardiac markers and EKG findings.  Prior history of both RCA and circumflex stenting during acute coronary syndrome dating back to 1998 and 2012.  Widely patent left main.  LAD with irregular up to 50% stenosis mid segment.  First diagonal which arises proximal to the mid segment is large and bifurcates.  The mid vessel contains 95% stenosis before the bifurcation.  Circumflex artery contains widely patent stent with one dominant obtuse marginal branch.  Less than 25% narrowing is noted within the stented segment.  2 layers of stent are present in the mid circumflex.  Widely patent dominant right coronary with up to 30% in-stent restenosis within the mid segment.  2 layers of stent are noted within the mid RCA.  Successful stent implantation mid diagonal #1 which presented with 95% stenosis with TIMI grade III flow and was reduced to 0% stenosis with TIMI III flow.  A 2.0 x 18 resolute [place]yx was postdilated to 2.25 mm in diameter.  RECOMMENDATIONS:   Aspirin and Plavix dual antiplatelet therapy for at least 6 months and preferably 12 months.  Aggressive risk factor modification: Blood pressure control, LDL less than 70, A1c less than 7, consideration of SGLT2 addition to diabetic regimen.  Eligible for a.m. Discharge.   He is currently hypertensive, will initiate losartan 25 mg tonight, with rapid uptitration as tolerated. Consider addition of amlodipine for further blood pressure control if needed.   Will see in AM, if stable, can consider DC.

## 2018-11-11 NOTE — Interval H&P Note (Signed)
Cath Lab Visit (complete for each Cath Lab visit)  Clinical Evaluation Leading to the Procedure:   ACS: Yes.    Non-ACS:    Anginal Classification: CCS III  Anti-ischemic medical therapy: Minimal Therapy (1 class of medications)  Non-Invasive Test Results: No non-invasive testing performed  Prior CABG: No previous CABG      History and Physical Interval Note:  11/11/2018 10:46 AM  [name]  has presented today for surgery, with the diagnosis of unstable angina  The various methods of treatment have been discussed with the patient and family. After consideration of risks, benefits and other options for treatment, the patient has consented to  Procedure(s): LEFT HEART CATH AND CORONARY ANGIOGRAPHY (N/A) as a surgical intervention .  The patient's history has been reviewed, patient examined, no change in status, stable for surgery.  I have reviewed the patient's chart and labs.  Questions were answered to the patient's satisfaction.     [name]

## 2018-11-11 NOTE — Progress Notes (Signed)
ANTICOAGULATION CONSULT NOTE - Follow Up Consult  Pharmacy Consult for heparin Indication: USAP  Labs: Recent Labs    11/10/18 1115 11/10/18 1508 11/10/18 1820 11/11/18 0021  HGB 14.6  --   --  12.6*  HCT 43.8  --   --  38.9*  PLT 177  --   --  158  APTT  --  59*  --   --   LABPROT  --  12.9  --   --   INR  --  0.98  --   --   HEPARINUNFRC  --   --  0.13* <0.10*  CREATININE 0.99  --   --  1.03  TROPONINI  --  <0.03 <0.03 <0.03    Assessment: 67yo male subtherapeutic on heparin with initial dosing for USAP; no gtt issues or signs of bleeding per RN.  Goal of Therapy:  Heparin level 0.3-0.7 units/ml   Plan:  Will rebolus with heparin 2000 units and increase heparin gtt by 4 units/kg/hr to 1200 units/hr and check level in 6 hours.    Wynona Neat, PharmD, BCPS  11/11/2018,1:24 AM

## 2018-11-11 NOTE — Progress Notes (Signed)
TR BAND REMOVAL  LOCATION:    right radial  DEFLATED PER PROTOCOL:    Yes.    TIME BAND OFF / DRESSING APPLIED:    1600   SITE UPON ARRIVAL:    Level 0  SITE AFTER BAND REMOVAL:    Level 0  CIRCULATION SENSATION AND MOVEMENT:    Within Normal Limits   Yes.    COMMENTS:    

## 2018-11-12 ENCOUNTER — Other Ambulatory Visit: Payer: Self-pay

## 2018-11-12 ENCOUNTER — Telehealth: Payer: Self-pay | Admitting: Cardiology

## 2018-11-12 LAB — BASIC METABOLIC PANEL
Anion gap: 11 (ref 5–15)
BUN: 11 mg/dL (ref 8–23)
CO2: 25 mmol/L (ref 22–32)
Calcium: 9.4 mg/dL (ref 8.9–10.3)
Chloride: 103 mmol/L (ref 98–111)
Creatinine, Ser: 1.06 mg/dL (ref 0.61–1.24)
GFR calc Af Amer: >60 mL/min (ref >60)
GFR calc non Af Amer: >60 mL/min (ref >60)
Glucose, Bld: 123 mg/dL — ABNORMAL HIGH (ref 70–99)
Potassium: 4.1 mmol/L (ref 3.5–5.1)
Sodium: 139 mmol/L (ref 135–145)

## 2018-11-12 LAB — GLUCOSE, CAPILLARY
Glucose-Capillary: 115 mg/dL — ABNORMAL HIGH (ref 70–99)
Glucose-Capillary: 126 mg/dL — ABNORMAL HIGH (ref 70–99)

## 2018-11-12 LAB — CBC
HCT: 43 % (ref 39.0–52.0)
Hemoglobin: 14.3 g/dL (ref 13.0–17.0)
MCH: 30.3 pg (ref 26.0–34.0)
MCHC: 33.3 g/dL (ref 30.0–36.0)
MCV: 91.1 fL (ref 80.0–100.0)
NRBC: 0 % (ref 0.0–0.2)
Platelets: 177 10*3/uL (ref 150–400)
RBC: 4.72 MIL/uL (ref 4.22–5.81)
RDW: 12.2 % (ref 11.5–15.5)
WBC: 10.6 10*3/uL — AB (ref 4.0–10.5)

## 2018-11-12 LAB — HEPARIN LEVEL (UNFRACTIONATED): Heparin Unfractionated: <0.1 IU/mL — ABNORMAL LOW (ref 0.30–0.70)

## 2018-11-12 MED ORDER — NITROGLYCERIN 0.4 MG SL SUBL: 0.4000 mg | SUBLINGUAL_TABLET | SUBLINGUAL | 0 refills | Status: DC | PRN

## 2018-11-12 MED ORDER — LOSARTAN POTASSIUM 50 MG PO TABS
50.0000 mg | ORAL_TABLET | Freq: Every day | ORAL | Status: DC
  Administered 2018-11-12: 13:00:00 50 mg via ORAL
  Filled 2018-11-12: qty 1

## 2018-11-12 MED ORDER — LOSARTAN POTASSIUM 50 MG PO TABS: 50.0000 mg | ORAL_TABLET | Freq: Every day | ORAL | 4 refills | Status: DC

## 2018-11-12 MED ORDER — CLOPIDOGREL BISULFATE 75 MG PO TABS: 75.0000 mg | ORAL_TABLET | Freq: Every day | ORAL | 4 refills | Status: DC

## 2018-11-12 MED ORDER — ATORVASTATIN CALCIUM 80 MG PO TABS: 80.0000 mg | ORAL_TABLET | Freq: Every day | ORAL | 4 refills | Status: DC

## 2018-11-12 MED FILL — LOSARTAN POTASSIUM 50 MG TA: 50 | 90 days supply | Qty: 90 | Fill #0

## 2018-11-12 MED FILL — NITROGLYCERIN 0.4 MG TAB SL: 0.4 | 8 days supply | Qty: 25 | Fill #0

## 2018-11-12 MED FILL — CLOPIDOGREL 75 MG TABLET: 75 | 90 days supply | Qty: 90 | Fill #0

## 2018-11-12 MED FILL — ATORVASTATIN CALCIUM 80 MG: 80 | 90 days supply | Qty: 90 | Fill #0

## 2018-11-12 NOTE — Progress Notes (Signed)
Progress Note  Patient Name: [name] Date of Encounter: 11/12/2018  Primary Cardiologist: Dennis Grooms, MD   Subjective   S/p PCI yesterday, very eager to go home.  Inpatient Medications    Scheduled Meds: . angioplasty book   Does not apply Once  . aspirin  81 mg Oral Daily  . atorvastatin  80 mg Oral q1800  . clopidogrel  75 mg Oral Q breakfast  . heparin  5,000 Units Subcutaneous Q8H  . insulin aspart  0-15 Units Subcutaneous TID WC  . insulin detemir  9 Units Subcutaneous Daily   And  . insulin detemir  14 Units Subcutaneous QHS  . losartan  50 mg Oral Daily  . metoprolol tartrate  50 mg Oral BID  . sodium chloride flush  3 mL Intravenous Q12H   Continuous Infusions: . sodium chloride    . nitroGLYCERIN Stopped (11/12/18 5993)   PRN Meds: sodium chloride, acetaminophen, nitroGLYCERIN, ondansetron (ZOFRAN) IV, sodium chloride flush   Vital Signs    Vitals:   11/12/18 0536 11/12/18 0632 11/12/18 0803 11/12/18 1127  BP: 125/71 (!) 156/76 (!) 144/89 (!) 149/71  Pulse: (!) 57  64 (!) 53  Resp: 13 19 18 16   Temp: 97.6 F (36.4 C)  98.1 F (36.7 C) 97.7 F (36.5 C)  TempSrc: Oral  Oral Oral  SpO2: 97%  97% 96%  Weight: 78 kg     Height:        Intake/Output Summary (Last 24 hours) at 11/12/2018 1329 Last data filed at 11/12/2018 0700 Gross per 24 hour  Intake 840 ml  Output 2155 ml  Net -1315 ml   Filed Weights   11/10/18 1452 11/11/18 0503 11/12/18 0536  Weight: 77.6 kg 77.4 kg 78 kg    Telemetry    NSR - Personally Reviewed  ECG    No new - Personally Reviewed  Physical Exam   GEN: No acute distress.   Neck: No JVD Cardiac: regular rhythm, normal rate, no murmurs, rubs, or gallops.  Respiratory: Clear to auscultation bilaterally. GI: Soft, nontender, non-distended  MS: No edema; No deformity. Neuro:  Nonfocal  Psych: Normal affect   Labs    Chemistry Recent Labs  Lab 11/10/18 1115 11/11/18 0021 11/12/18 0603  NA  136 131* 139  K 4.3 3.9 4.1  CL 103 100 103  CO2 24 23 25   GLUCOSE 210* 203* 123*  BUN 19 17 11   CREATININE 0.99 1.03 1.06  CALCIUM 9.2 8.5* 9.4  GFRNONAA >60 >60 >60  GFRAA >60 >60 >60  ANIONGAP 9 8 11      Hematology Recent Labs  Lab 11/10/18 1115 11/11/18 0021 11/12/18 0603  WBC 10.2 10.8* 10.6*  RBC 4.75 4.26 4.72  HGB 14.6 12.6* 14.3  HCT 43.8 38.9* 43.0  MCV 92.2 91.3 91.1  MCH 30.7 29.6 30.3  MCHC 33.3 32.4 33.3  RDW 12.3 12.3 12.2  PLT 177 158 177    Cardiac Enzymes Recent Labs  Lab 11/10/18 1508 11/10/18 1820 11/11/18 0021  TROPONINI <0.03 <0.03 <0.03    Recent Labs  Lab 11/10/18 1118  TROPIPOC 0.00     BNPNo results for input(s): BNP, PROBNP in the last 168 hours.   DDimer No results for input(s): DDIMER in the last 168 hours.   Radiology    No results found.  Cardiac Studies   No new  Patient Profile     [name] is a 67 year old male with coronary artery disease prior  PTCA of the RCA in 1998 in the setting of myocardial infarction with subsequent RCA stent during acute MI with DES placement in 2012 as well as staged PCI with circumflex at that time with diabetes hypertension hyperlipidemia, admitted for angina, now s/p PCI mid diagonal #1 which presented with 95% stenosis with TIMI grade III flow and was reduced to 0% stenosis with TIMI III flow.  A 2.0 x 18 resolute Onyx was postdilated to 2.25 mm in diameter.  Assessment & Plan   Principal Problem:   Unstable angina (HCC) Active Problems:   Essential hypertension   Diabetes mellitus, controlled ([name])   Hyperlipidemia   UA - will go home on regimen of: ASA, plavix, atorvastatin 80 mg (new), metoprolol tartrate 50 mg bid, losartan 50 mg daily (new).  HLD - zocor transitioned to lipitor for high intensity statin therapy with CAD HTN - losartan added for poorly controlled hypertension in hospital and DM2  Will arrange f/u in CV clinic in 1 month post PCI.   For questions or  updates, please contact [name] Please consult [email or web address] for contact info under        Signed, Dennis Munroe, MD  11/12/2018, 1:29 PM

## 2018-11-12 NOTE — Telephone Encounter (Signed)
  Has TOC appt 11/25/18 @ 2:00 w/ Cecilie Kicks

## 2018-11-12 NOTE — Progress Notes (Signed)
CARDIAC REHAB PHASE I   PRE:  Rate/Rhythm: 61 SR  BP:  Supine: 144/61  Sitting:   Standing:    SaO2:   MODE:  Ambulation: 800 ft   POST:  Rate/Rhythm: 68 SR  BP:  Supine:   Sitting: 170/69  Standing:    SaO2:  0900-0955 Pt walked 800 ft on RA with steady gait. Tolerated well. No CP. Education completed with pt and wife who voiced understanding. Stressed importance of plavix with stent. Reviewed NTG use, gave diabetic and heart healthy diets, ex ed and discussed CRP 2. Has attended CRP 2 before and wants to attend again. Referred to [place].    Graylon Good, RN BSN  11/12/2018 9:50 AM

## 2018-11-12 NOTE — Telephone Encounter (Signed)
**Note De-identified [name] Obfuscation** The pt is being discharged from the hospital today. We will call him tomorrow 

## 2018-11-12 NOTE — Discharge Summary (Signed)
Discharge Summary    Patient ID: [name] MRN: [id number]; DOB: 12-Oct-1951  Admit date: 11/10/2018 Discharge date: 11/12/2018 Primary Care Provider: [name], MD  Primary Cardiologist: Sinclair Grooms, MD   Discharge Diagnoses    Principal Problem:   Unstable angina [facility]) Active Problems:   Essential hypertension   Diabetes mellitus, controlled ([place])   Hyperlipidemia  Allergies No Known Allergies  Diagnostic Studies/Procedures    Cardiac catheterization 11/11/2018:  Progressive angina over the past 2 to 4 weeks prior to admission.  Negative cardiac markers and EKG findings.  Prior history of both RCA and circumflex stenting during acute coronary syndrome dating back to 1998 and 2012.  Widely patent left main.  LAD with irregular up to 50% stenosis mid segment.  First diagonal which arises proximal to the mid segment is large and bifurcates.  The mid vessel contains 95% stenosis before the bifurcation.  Circumflex artery contains widely patent stent with one dominant obtuse marginal branch.  Less than 25% narrowing is noted within the stented segment.  2 layers of stent are present in the mid circumflex.  Widely patent dominant right coronary with up to 30% in-stent restenosis within the mid segment.  2 layers of stent are noted within the mid RCA.  Successful stent implantation mid diagonal #1 which presented with 95% stenosis with TIMI grade III flow and was reduced to 0% stenosis with TIMI III flow.  A 2.0 x 18 resolute Onyx was postdilated to 2.25 mm in diameter.  RECOMMENDATIONS:   Aspirin and Plavix dual antiplatelet therapy for at least 6 months and preferably 12 months.  Aggressive risk factor modification: Blood pressure control, LDL less than 70, A1c less than 7, consideration of SGLT2 addition to diabetic regimen.  Eligible for a.m. Discharge.  Echocardiogram 11/11/2018:  Study Conclusions  - Left ventricle: The cavity size was normal. Systolic  function was   normal. The estimated ejection fraction was in the range of 55%   to 60%. Although no diagnostic regional wall motion abnormality   was identified, this possibility cannot be completely excluded on   the basis of this study. The study is not technically sufficient   to allow evaluation of LV diastolic function.  Impressions:  - Technically difficult study: only parasternal and subcostal   images could be obtained. No major wall motion abnormalities are   seen.  History of Present Illness    Mr. 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  Over the past week prior to admission he had been experiencing some left arm and left chest tightness which was worse with exertion and improved with rest. He reported giving himself 1-2 SL NTG with relief.  He called our Cardiology office on Friday, 11/08/18 but it was closed and he therefore came into the hospital given worsening discomfort. He denied associated fevers, chills, nausea, vomiting shortness of breath.  Given his presenting symptoms of unstable angina and prior RCA and circumflex history (single long 33 mm Xience DES in circumflex, 3.5 x 20 mm Promus DES in right coronary artery), plans were made to proceed to cardiac catheterization for further ischemic evaluation.  Hospital Course     Patient was  taken to the cardiac Cath Lab on 01/2017 which revealed a widely patent left main, LAD with irregular stenosis of the 50% mid segment with a first diagonal which arises proximal to the mid segment that is large and bifurcates. The mid vessel contains a 95% stenosis before the bifurcation in which a successful stent  implantation was placed (2.0 x 18 resolute Onyx was post dilated to 2.25 mm in diameter). Recommendations were for aspirin and Plavix dual antiplatelet therapy for at least 6 months and preferably 12 months in duration as well as aggressive risk factor modification including optimal blood pressure control, LDL less than 70, hemoglobin A1c less than 7 and consideration of SGLT2 addition to diabetic regimen. Echocardiogram performed 11/11/2018 with LVEF of 55 to 60% with no regional wall motion abnormalities.  Cath site remained unremarkable.  He is ambulated with cardiac rehabilitation without complication.  Patient has been seen and examined by Dr. Margaretann Loveless feels that he is stable and ready for discharge today, 11/12/2018.  Medication changes include: -ASA and Plavix for at least 6 months and preferably 12 months. -Losartan 50 mg with rapid up titration as tolerated -Consider the addition of amlodipine for further blood pressure control if needed in the outpatient setting  Consultants: None  Discharge Vitals Blood pressure (!) 149/71, pulse (!) 53, temperature 97.7 F (36.5 C), temperature source Oral, resp. rate 16, height 6\' 2"  (1.88 m), weight 78 kg, SpO2 96 %.  Filed Weights   11/10/18 1452 11/11/18 0503 11/12/18 0536  Weight: 77.6 kg 77.4 kg 78 kg   Labs & Radiologic Studies    CBC Recent Labs    11/10/18 1115 11/11/18 0021 11/12/18 0603  WBC 10.2 10.8* 10.6*  NEUTROABS 6.3  --   --   HGB 14.6 12.6* 14.3  HCT 43.8 38.9* 43.0  MCV 92.2 91.3 91.1  PLT 177 158 161   Basic Metabolic Panel Recent Labs    11/11/18 0021 11/12/18 0603  NA 131* 139  K 3.9 4.1  CL 100 103  CO2 23 25  GLUCOSE 203* 123*  BUN 17 11  CREATININE 1.03 1.06  CALCIUM 8.5* 9.4   Cardiac Enzymes Recent Labs    11/10/18 1508 11/10/18 1820 11/11/18 0021  TROPONINI <0.03 <0.03 <0.03   Fasting Lipid Panel Recent Labs    11/11/18 0021  CHOL 112  HDL 29*  LDLCALC 30  TRIG 266*  CHOLHDL 3.9    ____________  Dg Chest Port 1 View  Result Date: 11/10/2018 CLINICAL DATA:  Chest tightness and tension in left arm today. EXAM: PORTABLE CHEST 1 VIEW COMPARISON:  Single-view of the chest 03/02/2011. FINDINGS: The lungs are clear. Heart size is normal. Aortic atherosclerosis is noted. No pneumothorax or pleural effusion. No acute or focal bony abnormality. IMPRESSION: No acute disease. Atherosclerosis. Electronically Signed   By: Inge Rise M.D.   On: 11/10/2018 12:05   Disposition   Pt is being discharged home today in good condition.  Follow-up Plans & Appointments   Follow-up Information    Isaiah Serge, NP Follow up on 11/25/2018.   Specialties:  Cardiology, Radiology Why:  Your follow-up appointment will be on 11/25/2018 at 9:00 AM. Contact information: [place] [id number] [phone]          Discharge Instructions    Amb Referral to Cardiac Rehabilitation   Complete by:  As directed    Diagnosis:  Coronary Stents   Call MD for:  difficulty breathing, headache  or visual disturbances   Complete by:  As directed    Call MD for:  extreme fatigue   Complete by:  As directed    Call MD for:  hives   Complete by:  As directed    Call MD for:  persistant dizziness or light-headedness   Complete by:  As directed    Call MD for:  persistant nausea and vomiting   Complete by:  As directed    Call MD for:  redness, tenderness, or signs of infection (pain, swelling, redness, odor or green/yellow discharge around incision site)   Complete by:  As directed    Call MD for:  severe uncontrolled pain   Complete by:  As directed    Call MD for:  temperature >100.4   Complete by:  As directed    Diet - low sodium heart healthy   Complete by:  As directed    Discharge instructions   Complete by:  As directed    You will need to resume your Metformin on 11/14/18.   No driving for 3 days. No lifting over 5 lbs for 1 week. No sexual activity  for 1 week. Keep procedure site clean & dry. If you notice increased pain, swelling, bleeding or pus, call/return!  You may shower, but no soaking baths/hot tubs/pools for 1 week.   PLEASE DO NOT MISS ANY DOSES OF YOUR PLAVIX!!!!! Also keep a log of you blood pressures and bring back to your follow up appt. Please call the office with any questions.   Patients taking blood thinners should generally stay away from medicines like ibuprofen, Advil, Motrin, naproxen, and Aleve due to risk of stomach bleeding. You may take Tylenol as directed or talk to your primary doctor about alternatives.  Some studies suggest Prilosec/Omeprazole interacts with Plavix. If you have reflux symptoms, please take Protonix for less chance of interaction.   Increase activity slowly   Complete by:  As directed      Discharge Medications   Allergies as of 11/12/2018   No Known Allergies     Medication List    STOP taking these medications   simvastatin 40 MG tablet Commonly known as:  ZOCOR Replaced by:  atorvastatin 80 MG tablet     TAKE these medications   aspirin EC 81 MG tablet Take 1 tablet (81 mg total) by mouth daily. What changed:  Another medication with the same name was removed. Continue taking this medication, and follow the directions you see here.   atorvastatin 80 MG tablet Commonly known as:  LIPITOR Take 1 tablet (80 mg total) by mouth daily. Replaces:  simvastatin 40 MG tablet   calcium carbonate 750 MG chewable tablet Commonly known as:  TUMS EX Chew 2 tablets by mouth as needed for heartburn.   clopidogrel 75 MG tablet Commonly known as:  PLAVIX Take 1 tablet (75 mg total) by mouth daily with breakfast. Start taking on:  11/13/2018   fish oil-omega-3 fatty acids 1000 MG capsule Take 4 g by mouth daily.   LEVEMIR FLEXTOUCH 100 UNIT/ML Pen Generic drug:  Insulin Detemir Inject 18-28 Units into the skin 2 (two) times daily. Taking 18 units in the AM and 28 units at night.    losartan 50 MG tablet Commonly known as:  COZAAR Take 1 tablet (50 mg total) by mouth daily. Start taking on:  11/13/2018   metFORMIN 500 MG tablet Commonly known as:  GLUCOPHAGE Take 1,000 mg by mouth 2 (two) times daily  with a meal.   metoprolol tartrate 50 MG tablet Commonly known as:  LOPRESSOR TAKE 1 TABLET BY MOUTH TWICE A DAY   multivitamin tablet Take 1 tablet by mouth daily.   nitroGLYCERIN 0.4 MG SL tablet Commonly known as:  NITROSTAT Place 1 tablet (0.4 mg total) under the tongue every 5 (five) minutes x 3 doses as needed for chest pain.   Turmeric 500 MG Caps Take 500 mg by mouth as needed (joint pain).        Acute coronary syndrome (MI, NSTEMI, STEMI, etc) this admission?: No.    Outstanding Labs/Studies   BMET with the addition of losartan    Duration of Discharge Encounter   Greater than 30 minutes including physician time.  Signed, Kathyrn Drown, NP 11/12/2018, 1:37 PM

## 2018-11-13 NOTE — Telephone Encounter (Signed)
Patient contacted regarding discharge from [facility] on 11/12/18.  Patient understands to follow up with provider Cecilie Kicks, NP on 11/25/18 at 9:00 at [facility] in [place]. Patient understands discharge instructions? Yes Patient understands medications and regiment? Yes Patient understands to bring all medications to this visit? Yes

## 2018-11-22 ENCOUNTER — Telehealth (HOSPITAL_COMMUNITY): Payer: Self-pay

## 2018-11-22 NOTE — Telephone Encounter (Signed)
Called patient to see if he was interested in participating in the Cardiac Rehab Program. Patient stated yes. Patient will come in for orientation on 01/09/2019 @ 830AM and will attend the 245PM exercise class.  Mailed homework package.  Went over insurance, patient verbalized understanding.

## 2018-11-22 NOTE — Telephone Encounter (Signed)
Pt insurance is active and benefits verified through Medicare A/B. Co-pay $0.00, DED $185.00/$185.00 met, out of pocket $0.00/$0.00 met, co-insurance 20%. No pre-authorization required. Passport, 11/22/18 @ 11:11AM, REF# [id number]  2ndary insurance is active and benefits verified through [employer]. Co-pay $0.00, DED $0.00/$0.00 met, out of pocket $0.00/$0.00 met, co-insurance 0%. No pre-authorization required.

## 2018-11-24 NOTE — Progress Notes (Signed)
Cardiology Office Note   Date:  11/25/2018   ID:  [name], DOB [birth year], MRN [id number]  PCP:  [name], MD  Cardiologist:  Dr. [name]    Chief Complaint  Patient presents with  . Hospitalization Follow-up      History of Present Illness: [name] is a 67 y.o. 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  Pt's wife is dead  --admitted  with anginal symptoms.  He had lt arm and Lt chest tightness increased with exertion and improved with rest.  At times would take NTG with relief.    Neg MI, cath with Successful stent implantation mid diagonal #1 which presented with 95% stenosis with TIMI grade III flow and was reduced to 0% stenosis with TIMI III flow.  A 2.0 x 18 resolute Onyx was postdilated to 2.25 mm in diameter. Echo with EF 55-60%   Since discharge has had no pain, no SOB. Feels much better.  He requests we keep him going for 5 more years.  He eats fast foods at times.  But also fruit and nuts, cheese.   Not much sugar.  He is on lipitor 80 mg daily.  He does exercise twice a day by walking.     Will need BMET with addition of losartan.   EKG today with freq PACs not felt on pulse oxy.  Pt is unaware of them.   No lightheadedness or dizziness.   Past Medical History:  Diagnosis Date  . Coronary artery disease CARDIOLOGIST- DR [name]  . Diabetes mellitus without complication ([place]) 7169   oral,insulin management  . History of acute inferior wall myocardial infarction    03-01-2012  STEMI  . History of kidney stones   . History of MI (myocardial infarction) 1998    . S/P drug eluting coronary stent placement   . Ureteral  calculi left    Past Surgical History:  Procedure Laterality Date  . CORONARY ANGIOPLASTY WITH STENT PLACEMENT  1998  [facility])   [place]  . CORONARY ANGIOPLASTY WITH STENT PLACEMENT  03/03/2011   STENTING OF IN-STENT RESTENOSIS OF LEFT CIRDUMFLEX  (drug-eluting)  . CORONARY STENT INTERVENTION N/A 11/11/2018   Procedure: CORONARY STENT INTERVENTION;  Surgeon: Belva Crome, MD;  Location: [place] [facility];  Service: Cardiovascular;  Laterality: N/A;  . LEFT HEART CATH AND CORONARY ANGIOGRAPHY N/A 11/11/2018   Procedure: LEFT HEART CATH AND CORONARY ANGIOGRAPHY;  Surgeon: Belva Crome, MD;  Location: [place] [facility];  Service: Cardiovascular;  Laterality: N/A;     Current Outpatient Medications  Medication Sig Dispense Refill  . aspirin EC 81 MG tablet Take 1 tablet (81 mg total) by mouth daily. 90 tablet 3  . atorvastatin (LIPITOR) 80 MG tablet Take 1 tablet (80 mg total) by mouth daily. 90 tablet 4  . calcium carbonate (TUMS EX) 750 MG chewable tablet Chew 2 tablets by mouth as needed for heartburn.    . clopidogrel (PLAVIX) 75 MG tablet Take 1 tablet (75 mg total) by mouth daily with breakfast. 90 tablet 4  . fish oil-omega-3 fatty acids 1000  MG capsule Take 4 g by mouth daily.     [name] LEVEMIR FLEXTOUCH 100 UNIT/ML Pen Inject 18-28 Units into the skin 2 (two) times daily. Taking 18 units in the AM and 28 units at night.    . losartan (COZAAR) 50 MG tablet Take 1 tablet (50 mg total) by mouth daily. 90 tablet 4  . metFORMIN (GLUCOPHAGE) 500 MG tablet Take 1,000 mg by mouth 2 (two) times daily with a meal.     . metoprolol tartrate (LOPRESSOR) 50 MG tablet TAKE 1 TABLET BY MOUTH TWICE A DAY 180 tablet 3  . Multiple Vitamin (MULTIVITAMIN) tablet Take 1 tablet by mouth daily.    . nitroGLYCERIN (NITROSTAT) 0.4 MG SL tablet Place 1 tablet (0.4 mg total) under the tongue every 5 (five) minutes x 3 doses as needed for chest pain. 25 tablet 0  . Turmeric 500 MG  CAPS Take 500 mg by mouth as needed (joint pain).     No current facility-administered medications for this visit.     Allergies:   Patient has no known allergies.    Social History:  The patient  reports that he quit smoking about 29 years ago. He has never used smokeless tobacco. He reports current alcohol use. He reports that he does not use drugs.   Family History:  The patient's family history includes COPD in his mother; Diabetes in his brother; Healthy in his father; Heart attack in his brother; Kidney disease in his mother.    ROS:  General:no colds or fevers, no weight changes ( wt is higher this admit but has on boots and winter clothes and last wt. Noted was from hospital.  Skin:no rashes or ulcers HEENT:no blurred vision, no congestion CV:see HPI PUL:see HPI GI:no diarrhea constipation or melena, no indigestion GU:no hematuria, no dysuria MS:no joint pain, no claudication Neuro:no syncope, no lightheadedness Endo:+ diabetes stable, no thyroid disease  Wt Readings from Last 3 Encounters:  11/25/18 180 lb (81.6 kg)  11/12/18 171 lb 15.3 oz (78 kg)  11/12/17 182 lb 6.4 oz (82.7 kg)     PHYSICAL EXAM: VS:  BP 136/60   Pulse (!) 39   Ht 6\' 2"  (1.88 m)   Wt 180 lb (81.6 kg)   SpO2 96%   BMI 23.11 kg/m  , BMI Body mass index is 23.11 kg/m. General:Pleasant affect, NAD Skin:Warm and dry, brisk capillary refill HEENT:normocephalic, sclera clear, mucus membranes moist Neck:supple, no JVD, no bruits  Heart:S1S2 RRR without murmur, gallup, rub or click Lungs:clear without rales, rhonchi, or wheezes WJX:BJYN, non tender, + BS, do not palpate liver spleen or masses Ext:no lower ext edema, 2+ pedal pulses, 2+ radial pulses Neuro:alert and oriented X 3, MAE, follows commands, + facial symmetry    EKG:  EKG is ordered today. The ekg ordered today demonstrates SR with PACs freq, but otherwise normal EKG.  PACs are new    Recent Labs: 11/12/2018: BUN 11; Creatinine,  Ser 1.06; Hemoglobin 14.3; Platelets 177; Potassium 4.1; Sodium 139    Lipid Panel    Component Value Date/Time   CHOL 112 11/11/2018 0021   TRIG 266 (H) 11/11/2018 0021   HDL 29 (L) 11/11/2018 0021   CHOLHDL 3.9 11/11/2018 0021   VLDL 53 (H) 11/11/2018 0021   LDLCALC 30 11/11/2018 0021       Other studies Reviewed: Additional studies/ records that were reviewed today include: . Cardiac cath 11/11/18  Progressive angina over the past 2 to 4 weeks prior to  admission.  Negative cardiac markers and EKG findings.  Prior history of both RCA and circumflex stenting during acute coronary syndrome dating back to 1998 and 2012.  Widely patent left main.  LAD with irregular up to 50% stenosis mid segment.  First diagonal which arises proximal to the mid segment is large and bifurcates.  The mid vessel contains 95% stenosis before the bifurcation.  Circumflex artery contains widely patent stent with one dominant obtuse marginal branch.  Less than 25% narrowing is noted within the stented segment.  2 layers of stent are present in the mid circumflex.  Widely patent dominant right coronary with up to 30% in-stent restenosis within the mid segment.  2 layers of stent are noted within the mid RCA.  Successful stent implantation mid diagonal #1 which presented with 95% stenosis with TIMI grade III flow and was reduced to 0% stenosis with TIMI III flow.  A 2.0 x 18 resolute Onyx was postdilated to 2.25 mm in diameter.  RECOMMENDATIONS:   Aspirin and Plavix dual antiplatelet therapy for at least 6 months and preferably 12 months.  Aggressive risk factor modification: Blood pressure control, LDL less than 70, A1c less than 7, consideration of SGLT2 addition to diabetic regimen.  Eligible for a.m. discharge.   Echo 11/11/18 Study Conclusions  - Left ventricle: The cavity size was normal. Systolic function was   normal. The estimated ejection fraction was in the range of 55%   to 60%.  Although no diagnostic regional wall motion abnormality   was identified, this possibility cannot be completely excluded on   the basis of this study. The study is not technically sufficient   to allow evaluation of LV diastolic function.  Impressions:  - Technically difficult study: only parasternal and subcostal   images could be obtained. No major wall motion abnormalities are   seen.  ASSESSMENT AND PLAN:  1.  CAD with hx of stents and now with unstable angina with hospitalization no MI and cath with new stent to diag.  No further symptoms feel well  Follow up with Dr. [name] in 2-3 months. Continue asa and plavix.   2.  HTN controlled continue BB and ARB  3.  HLD on lipitor 80 will recheck hepatic and lipid in 6-8 weeks.  Continue statin.   4.  DM per PCP.    5.  PACs asymptomatic.     Current medicines are reviewed with the patient today.  The patient Has no concerns regarding medicines.  The following changes have been made:  See above Labs/ tests ordered today include:see above  Disposition:   FU:  see above  Signed, Cecilie Kicks, NP  11/25/2018 9:25 AM    [place] [place], [place], [place] Phone: [phone]; Fax: [phone]

## 2018-11-25 ENCOUNTER — Ambulatory Visit (INDEPENDENT_AMBULATORY_CARE_PROVIDER_SITE_OTHER): Payer: Medicare Other | Admitting: Cardiology

## 2018-11-25 ENCOUNTER — Encounter: Payer: Self-pay | Admitting: Cardiology

## 2018-11-25 VITALS — BP 136/60 | HR 39 | Ht 74.0 in | Wt 180.0 lb

## 2018-11-25 DIAGNOSIS — I10 Essential (primary) hypertension: Secondary | ICD-10-CM | POA: Diagnosis not present

## 2018-11-25 DIAGNOSIS — E7800 Pure hypercholesterolemia, unspecified: Secondary | ICD-10-CM

## 2018-11-25 DIAGNOSIS — E1159 Type 2 diabetes mellitus with other circulatory complications: Secondary | ICD-10-CM

## 2018-11-25 DIAGNOSIS — Z95820 Peripheral vascular angioplasty status with implants and grafts: Secondary | ICD-10-CM | POA: Diagnosis not present

## 2018-11-25 DIAGNOSIS — I2510 Atherosclerotic heart disease of native coronary artery without angina pectoris: Secondary | ICD-10-CM

## 2018-11-25 DIAGNOSIS — I200 Unstable angina: Secondary | ICD-10-CM | POA: Diagnosis not present

## 2018-11-25 DIAGNOSIS — I25810 Atherosclerosis of coronary artery bypass graft(s) without angina pectoris: Secondary | ICD-10-CM | POA: Diagnosis not present

## 2018-11-25 LAB — BASIC METABOLIC PANEL
BUN/Creatinine Ratio: 20 (ref 10–24)
BUN: 21 mg/dL (ref 8–27)
CO2: 23 mmol/L (ref 20–29)
Calcium: 9.7 mg/dL (ref 8.6–10.2)
Chloride: 101 mmol/L (ref 96–106)
Creatinine, Ser: 1.07 mg/dL (ref 0.76–1.27)
GFR calc Af Amer: 84 mL/min/{1.73_m2} (ref >59)
GFR calc non Af Amer: 72 mL/min/{1.73_m2} (ref >59)
Glucose: 242 mg/dL — ABNORMAL HIGH (ref 65–99)
Potassium: 4.9 mmol/L (ref 3.5–5.2)
SODIUM: 136 mmol/L (ref 134–144)

## 2018-11-25 NOTE — Patient Instructions (Addendum)
Medication Instructions:  Your physician recommends that you continue on your current medications as directed. Please refer to the Current Medication list given to you today.  If you need a refill on your cardiac medications before your next appointment, please call your pharmacy.   Lab work: TODAY:  BMET 01/20/19: (AT [name])  FASTING LIPID / LFT   If you have labs (blood work) drawn today and your tests are completely normal, you will receive your results only by: [name] MyChart Message (if you have MyChart) OR . A paper copy in the mail If you have any lab test that is abnormal or we need to change your treatment, we will call you to review the results.  Testing/Procedures: None ordered   Follow-Up: At [facility], you and your health needs are our priority.  As part of our continuing mission to provide you with exceptional heart care, we have created designated Provider Care Teams.  These Care Teams include your primary Cardiologist (physician) and Advanced Practice Providers (APPs -  Physician Assistants and Nurse Practitioners) who all work together to provide you with the care you need, when you need it. You will need a follow up appointment ON 01/20/19 ARRIVE AT 10:00 TO SEE DR. [name].  BE SURE TO COME FASTING FOR YOUR BLOOD WORK

## 2018-12-20 ENCOUNTER — Other Ambulatory Visit: Payer: Self-pay | Admitting: Interventional Cardiology

## 2019-01-02 ENCOUNTER — Telehealth (HOSPITAL_COMMUNITY): Payer: Self-pay

## 2019-01-08 NOTE — Telephone Encounter (Signed)
Cardiac Rehab - Pharmacy Resident Documentation   Patient unable to be reached after three call attempts. Please complete allergy verification and medication review during patient's cardiac rehab appointment.    [name], PharmD PGY1 Pharmacy Resident 01/08/2019    5:03 PM Please check AMION for all [place] numbers

## 2019-01-08 NOTE — Progress Notes (Signed)
[name] 68 y.o. male DOB [birth year] MRN [id number]       Nutrition Screener Note  No diagnosis found. Past Medical History:  Diagnosis Date  . Coronary artery disease CARDIOLOGIST- DR [name]  . Diabetes mellitus without complication ([place]) 0240   oral,insulin management  . History of acute inferior wall myocardial infarction    03-01-2012  STEMI  . History of kidney stones   . History of MI (myocardial infarction) 1998    . S/P drug eluting coronary stent placement   . Ureteral calculi left   Meds reviewed.    Current Outpatient Medications (Endocrine & Metabolic):  [name]  LEVEMIR FLEXTOUCH 100 UNIT/ML Pen, Inject 18-28 Units into the skin 2 (two) times daily. Taking 18 units in the AM and 28 units at night. .  metFORMIN (GLUCOPHAGE) 500 MG tablet, Take 1,000 mg by mouth 2 (two) times daily with a meal.   Current Outpatient Medications (Cardiovascular):  .  atorvastatin (LIPITOR) 80 MG tablet, Take 1 tablet (80 mg total) by mouth daily. [name]  losartan (COZAAR) 50 MG tablet, Take 1 tablet (50 mg total) by mouth daily. .  metoprolol tartrate (LOPRESSOR) 50 MG tablet, TAKE ONE TABLET BY MOUTH TWICE DAILY  .  nitroGLYCERIN (NITROSTAT) 0.4 MG SL tablet, Place 1 tablet (0.4 mg total) under the tongue every 5 (five) minutes x 3 doses as needed for chest pain.   Current Outpatient Medications (Analgesics):  .  aspirin EC 81 MG tablet, Take 1 tablet (81 mg total) by mouth daily.  Current Outpatient Medications (Hematological):  .  clopidogrel (PLAVIX) 75 MG tablet, Take 1 tablet (75 mg total) by mouth daily with breakfast.  Current Outpatient Medications (Other):  .  calcium carbonate (TUMS EX) 750 MG chewable tablet, Chew 2 tablets by mouth as needed for heartburn. .  fish oil-omega-3 fatty acids 1000 MG capsule, Take 4 g by mouth daily.  .  Multiple Vitamin (MULTIVITAMIN) tablet, Take 1 tablet by mouth daily. .  Turmeric 500 MG CAPS, Take 500 mg by mouth as needed (joint  pain).   HT: Ht Readings from Last 1 Encounters:  11/25/18 6\' 2"  (1.88 m)    WT: Wt Readings from Last 5 Encounters:  11/25/18 180 lb (81.6 kg)  11/12/18 171 lb 15.3 oz (78 kg)  11/12/17 182 lb 6.4 oz (82.7 kg)  10/17/16 177 lb 6.4 oz (80.5 kg)  08/26/15 180 lb 12.8 oz (82 kg)     BMI = 23.1   11/25/18  Current tobacco use? No       Labs:  Lipid Panel     Component Value Date/Time   CHOL 112 11/11/2018 0021   TRIG 266 (H) 11/11/2018 0021   HDL 29 (L) 11/11/2018 0021   CHOLHDL 3.9 11/11/2018 0021   VLDL 53 (H) 11/11/2018 0021   LDLCALC 30 11/11/2018 0021    Lab Results  Component Value Date   HGBA1C (H) 03/04/2011    8.8 (NOTE)                                                                       According to the ADA Clinical Practice Recommendations for 2011, when HbA1c is used as a screening test:   >=6.5%  Diagnostic of Diabetes Mellitus           (if abnormal result  is confirmed)  5.7-6.4%   Increased risk of developing Diabetes Mellitus  References:Diagnosis and Classification of Diabetes Mellitus,Diabetes DJSH,7026,37(CHYIF 1):S62-S69 and Standards of Medical Care in         Diabetes - 2011,Diabetes Care,2011,34  (Suppl 1):S11-S61.   CBG (last 3)  No results for input(s): GLUCAP in the last 72 hours.  Nutrition Diagnosis ? Food-and nutrition-related knowledge deficit related to lack of exposure to information as related to diagnosis of: ? CVD ? DM  Nutrition Goal(s):  ? To be determined  Plan:  Pt to attend nutrition classes ? Nutrition I ? Nutrition II ? Portion Distortion  ? Diabetes Blitz ? Diabetes Q & A Will provide client-centered nutrition education as part of interdisciplinary care.   Monitor and evaluate progress toward nutrition goal with team.  Laurina Bustle, MS, RD, LDN 01/08/2019 1:31 PM

## 2019-01-09 ENCOUNTER — Encounter (HOSPITAL_COMMUNITY)
Admission: RE | Admit: 2019-01-09 | Discharge: 2019-01-09 | Disposition: A | Discharge status: Home / Self Care | Payer: Medicare Other | Source: Ambulatory Visit | Attending: Interventional Cardiology | Admitting: Interventional Cardiology

## 2019-01-09 VITALS — BP 118/78 | HR 90 | Ht 75.0 in | Wt 177.0 lb

## 2019-01-09 DIAGNOSIS — Z955 Presence of coronary angioplasty implant and graft: Secondary | ICD-10-CM | POA: Diagnosis not present

## 2019-01-09 NOTE — Progress Notes (Signed)
Cardiac Individual Treatment Plan  Patient Details  Name: [name] MRN: [id number] Date of Birth: [birth year] Referring Provider:     CARDIAC REHAB PHASE II ORIENTATION from 01/09/2019 in [place]  Referring Provider  Dr. [name]      Initial Encounter Date:    CARDIAC REHAB PHASE II ORIENTATION from 01/09/2019 in [place]  Date  01/09/19      Visit Diagnosis: Status post coronary artery stent placement  Patient's Home Medications on Admission:  Current Outpatient Medications:  .  aspirin EC 81 MG tablet, Take 1 tablet (81 mg total) by mouth daily., Disp: 90 tablet, Rfl: 3 .  atorvastatin (LIPITOR) 80 MG tablet, Take 1 tablet (80 mg total) by mouth daily., Disp: 90 tablet, Rfl: 4 .  calcium carbonate (TUMS EX) 750 MG chewable tablet, Chew 2 tablets by mouth as needed for heartburn., Disp: , Rfl:  .  clopidogrel (PLAVIX) 75 MG tablet, Take 1 tablet (75 mg total) by mouth daily with breakfast., Disp: 90 tablet, Rfl: 4 .  fish oil-omega-3 fatty acids 1000 MG capsule, Take 4 g by mouth daily. , Disp: , Rfl:  .  LEVEMIR FLEXTOUCH 100 UNIT/ML Pen, Inject 18-28 Units into the skin 2 (two) times daily. Taking 18 units in the AM and 28 units at night., Disp: , Rfl:  .  losartan (COZAAR) 50 MG tablet, Take 1 tablet (50 mg total) by mouth daily., Disp: 90 tablet, Rfl: 4 .  metFORMIN (GLUCOPHAGE) 500 MG tablet, Take 1,000 mg by mouth 2 (two) times daily with a meal. , Disp: , Rfl:  .  metoprolol tartrate (LOPRESSOR) 50 MG tablet, TAKE ONE TABLET BY MOUTH TWICE DAILY , Disp: 180 tablet, Rfl: 3 .  Multiple Vitamin (MULTIVITAMIN) tablet, Take 1 tablet by mouth daily., Disp: , Rfl:  .  nitroGLYCERIN (NITROSTAT) 0.4 MG SL tablet, Place 1 tablet (0.4 mg total) under the tongue every 5 (five) minutes x 3 doses as needed for chest pain., Disp: 25 tablet, Rfl: 0 .  Turmeric 500 MG CAPS, Take 500 mg by mouth as needed (joint pain).,  Disp: , Rfl:   Past Medical History: Past Medical History:  Diagnosis Date  . Coronary artery disease CARDIOLOGIST- DR [name]  . Diabetes mellitus without complication ([place]) 4098   oral,insulin management  . History of acute inferior wall myocardial infarction    03-01-2012  STEMI  . History of kidney stones   . History of MI (myocardial infarction) 1998    . S/P drug eluting coronary stent placement   . Ureteral calculi left    Tobacco Use: Social History   Tobacco Use  Smoking Status Former Smoker  . Last attempt to quit: 02/12/1989  . Years since quitting: 29.9  Smokeless Tobacco Never Used    Labs: Recent Review Flowsheet Data    Labs for ITP Cardiac and Pulmonary Rehab Latest Ref Rng & Units 03/02/2011 03/04/2011 03/09/2014 08/26/2015 11/11/2018   Cholestrol 0 - 200 mg/dL - - 160 131 112   LDLCALC 0 - 99 mg/dL - - 98 73 30   HDL >40 mg/dL - - 38.50(L) 41.40 29(L)   Trlycerides <150 mg/dL - - 116.0 82.0 266(H)   Hemoglobin A1c <5.7 % 8.4 (NOTE)  According to the ADA Clinical Practice Recommendations for 2011, when HbA1c is used as a screening test:   >=6.5%   Diagnostic of Diabetes Mellitus           (if abnormal result is confirmed)  5.7-6.4%   Increased risk of developing Diabetes Mellitus  References:Diagnosis and Classification of Diabetes Mellitus,Diabetes DGUY,4034,74(QVZDG 1):S62-S69 and Standards of Medical Care in         Diabetes - 2011,Diabetes LOVF,6433,29 (Suppl 1):S11-S61.(H) 8.8 (NOTE)                                                                       According to the ADA Clinical Practice Recommendations for 2011, when HbA1c is used as a screening test:   >=6.5%   Diagnostic of Diabetes Mellitus           (if abnormal result is confirmed)  5.7-6.4%   Increased risk of developing Diabetes Mellitus  References:Diagnosis and Classification of Diabetes Mellitus,Diabetes JJOA,4166,06(TKZSW  1):S62-S69 and Standards of Medical Care in         Diabetes - 2011,Diabetes FUXN,2355,73 (Suppl 1):S11-S61.(H) - - -   TCO2 0 - 100 mmol/L 22 - - - -      Capillary Blood Glucose: Lab Results  Component Value Date   GLUCAP 126 (H) 11/12/2018   GLUCAP 115 (H) 11/12/2018   GLUCAP 139 (H) 11/11/2018   GLUCAP 201 (H) 11/11/2018   GLUCAP 143 (H) 11/11/2018     Exercise Target Goals: Exercise Program Goal: Individual exercise prescription set using results from initial 6 min walk test and THRR while considering  patient's activity barriers and safety.   Exercise Prescription Goal: Initial exercise prescription builds to 30-45 minutes a day of aerobic activity, 2-3 days per week.  Home exercise guidelines will be given to patient during program as part of exercise prescription that the participant will acknowledge.  Activity Barriers & Risk Stratification: Activity Barriers & Cardiac Risk Stratification - 01/09/19 1100      Activity Barriers & Cardiac Risk Stratification   Activity Barriers  Joint Problems    Cardiac Risk Stratification  Moderate       6 Minute Walk: 6 Minute Walk    Row Name 01/09/19 1100         6 Minute Walk   Phase  Initial     Distance  1671 feet     Walk Time  6 minutes     # of Rest Breaks  0     MPH  3.2     METS  4.56     RPE  11     VO2 Peak  15.97     Symptoms  Yes (comment)     Comments  Hip pain.      Resting HR  90 bpm     Resting BP  118/78     Resting Oxygen Saturation   98 %     Exercise Oxygen Saturation  during 6 min walk  99 %     Max Ex. HR  107 bpm     Max Ex. BP  152/80     2 Minute Post BP  122/70        Oxygen Initial Assessment:   Oxygen  Re-Evaluation:   Oxygen Discharge (Final Oxygen Re-Evaluation):   Initial Exercise Prescription: Initial Exercise Prescription - 01/09/19 1100      Date of Initial Exercise RX and Referring Provider   Date  01/09/19    Referring Provider  Dr. [name]    Expected Discharge  Date  04/18/19      Bike   Level  1    Minutes  10    METs  3.36      NuStep   Level  3    SPM  85    Minutes  10    METs  4.4      Track   Laps  12    Minutes  10    METs  3.07      Prescription Details   Frequency (times per week)  3    Duration  Progress to 30 minutes of continuous aerobic without signs/symptoms of physical distress      Intensity   THRR 40-80% of Max Heartrate  62-124    Ratings of Perceived Exertion  11-13      Progression   Progression  Continue to progress workloads to maintain intensity without signs/symptoms of physical distress.      Resistance Training   Training Prescription  Yes    Weight  4 lbs.     Reps  10-15       Perform Capillary Blood Glucose checks as needed.  Exercise Prescription Changes:   Exercise Comments:   Exercise Goals and Review: Exercise Goals    Row Name 01/09/19 1101             Exercise Goals   Increase Physical Activity  Yes       Intervention  Provide advice, education, support and counseling about physical activity/exercise needs.;Develop an individualized exercise prescription for aerobic and resistive training based on initial evaluation findings, risk stratification, comorbidities and participant's personal goals.       Expected Outcomes  Short Term: Attend rehab on a regular basis to increase amount of physical activity.;Long Term: Add in home exercise to make exercise part of routine and to increase amount of physical activity.;Long Term: Exercising regularly at least 3-5 days a week.       Increase Strength and Stamina  Yes       Intervention  Provide advice, education, support and counseling about physical activity/exercise needs.;Develop an individualized exercise prescription for aerobic and resistive training based on initial evaluation findings, risk stratification, comorbidities and participant's personal goals.       Expected Outcomes  Short Term: Increase workloads from initial exercise  prescription for resistance, speed, and METs.       Able to understand and use rate of perceived exertion (RPE) scale  Yes       Intervention  Provide education and explanation on how to use RPE scale       Expected Outcomes  Short Term: Able to use RPE daily in rehab to express subjective intensity level;Long Term:  Able to use RPE to guide intensity level when exercising independently       Knowledge and understanding of Target Heart Rate Range (THRR)  Yes       Intervention  Provide education and explanation of THRR including how the numbers were predicted and where they are located for reference       Expected Outcomes  Short Term: Able to state/look up THRR;Long Term: Able to use THRR to govern intensity when exercising independently;Short Term: Able  to use daily as guideline for intensity in rehab       Able to check pulse independently  Yes       Intervention  Provide education and demonstration on how to check pulse in carotid and radial arteries.;Review the importance of being able to check your own pulse for safety during independent exercise       Expected Outcomes  Short Term: Able to explain why pulse checking is important during independent exercise;Long Term: Able to check pulse independently and accurately       Understanding of Exercise Prescription  Yes       Intervention  Provide education, explanation, and written materials on patient's individual exercise prescription       Expected Outcomes  Short Term: Able to explain program exercise prescription;Long Term: Able to explain home exercise prescription to exercise independently          Exercise Goals Re-Evaluation :   Discharge Exercise Prescription (Final Exercise Prescription Changes):   Nutrition:  Target Goals: Understanding of nutrition guidelines, daily intake of sodium 1500mg , cholesterol 200mg , calories 30% from fat and 7% or less from saturated fats, daily to have 5 or more servings of fruits and  vegetables.  Biometrics: Pre Biometrics - 01/09/19 1101      Pre Biometrics   Height  6\' 3"  (1.905 m)    Weight  80.3 kg    Waist Circumference  35 inches    Hip Circumference  39 inches    Waist to Hip Ratio  0.9 %    BMI (Calculated)  22.13    Triceps Skinfold  25 mm    % Body Fat  24.7 %    Grip Strength  36 kg    Flexibility  0 in    Single Leg Stand  3.12 seconds        Nutrition Therapy Plan and Nutrition Goals: Nutrition Therapy & Goals - 01/09/19 1045      Nutrition Therapy   Diet  heart healthy, carb modified      Personal Nutrition Goals   Nutrition Goal  to be determined       Nutrition Assessments: Nutrition Assessments - 01/09/19 1008      MEDFICTS Scores   Pre Score  26       Nutrition Goals Re-Evaluation:   Nutrition Goals Re-Evaluation:   Nutrition Goals Discharge (Final Nutrition Goals Re-Evaluation):   Psychosocial: Target Goals: Acknowledge presence or absence of significant depression and/or stress, maximize coping skills, provide positive support system. Participant is able to verbalize types and ability to use techniques and skills needed for reducing stress and depression.  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       Family Dynamics   Good Support System?  Yes   Pt lists his family and church as sources of support.    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      Barriers   Psychosocial barriers to participate in program  The patient should benefit from training in stress management and relaxation.      Screening Interventions   Interventions  Encouraged to exercise    Expected Outcomes  Short Term  goal: Utilizing psychosocial counselor, staff and physician to assist with  identification of specific Stressors or current issues interfering with healing process. Setting desired goal for each stressor or current issue identified.;Long Term Goal: Stressors or current issues are controlled or eliminated.;Short Term goal: Identification and review with participant of any Quality of Life or Depression concerns found by scoring the questionnaire.;Long Term goal: The participant improves quality of Life and PHQ9 Scores as seen by post scores and/or verbalization of changes       Quality of Life Scores: Quality of Life - 01/09/19 1058      Quality of Life   Select  Quality of Life      Quality of Life Scores   Health/Function Pre  20 %    Socioeconomic Pre  21.71 %    Psych/Spiritual Pre  12.93 %    Family Pre  15.1 %    GLOBAL Pre  18.18 %      Scores of 19 and below usually indicate a poorer quality of life in these areas.  A difference of  2-3 points is a clinically meaningful difference.  A difference of 2-3 points in the total score of the Quality of Life Index has been associated with significant improvement in overall quality of life, self-image, physical symptoms, and general health in studies assessing change in quality of life.  PHQ-9: Recent Review Flowsheet Data    There is no flowsheet data to display.     Interpretation of Total Score  Total Score Depression Severity:  1-4 = Minimal depression, 5-9 = Mild depression, 10-14 = Moderate depression, 15-19 = Moderately severe depression, 20-27 = Severe depression   Psychosocial Evaluation and Intervention:   Psychosocial Re-Evaluation:   Psychosocial Discharge (Final Psychosocial Re-Evaluation):   Vocational Rehabilitation: Provide vocational rehab assistance to qualifying candidates.   Vocational Rehab Evaluation & Intervention: Vocational Rehab - 01/09/19 1217      Initial Vocational Rehab Evaluation & Intervention   Assessment shows need for Vocational Rehabilitation  No        Education: Education Goals: Education classes will be provided on a weekly basis, covering required topics. Participant will state understanding/return demonstration of topics presented.  Learning Barriers/Preferences: Learning Barriers/Preferences - 01/09/19 1058      Learning Barriers/Preferences   Learning Barriers  Sight    Learning Preferences  Written Material       Education Topics: Count Your Pulse:  -Group instruction provided by verbal instruction, demonstration, patient participation and written materials to support subject.  Instructors address importance of being able to find your pulse and how to count your pulse when at home without a heart monitor.  Patients get hands on experience counting their pulse with staff help and individually.   Heart Attack, Angina, and Risk Factor Modification:  -Group instruction provided by verbal instruction, video, and written materials to support subject.  Instructors address signs and symptoms of angina and heart attacks.    Also discuss risk factors for heart disease and how to make changes to improve heart health risk factors.   Functional Fitness:  -Group instruction provided by verbal instruction, demonstration, patient participation, and written materials to support subject.  Instructors address safety measures for doing things around the house.  Discuss how to get up and down off the floor, how to pick things up properly, how to safely get out of a chair without assistance, and balance training.   Meditation and Mindfulness:  -Group instruction provided by verbal instruction, patient participation,  and written materials to support subject.  Instructor addresses importance of mindfulness and meditation practice to help reduce stress and improve awareness.  Instructor also leads participants through a meditation exercise.    Stretching for Flexibility and Mobility:  -Group instruction provided by verbal instruction, patient  participation, and written materials to support subject.  Instructors lead participants through series of stretches that are designed to increase flexibility thus improving mobility.  These stretches are additional exercise for major muscle groups that are typically performed during regular warm up and cool down.   Hands Only CPR:  -Group verbal, video, and participation provides a basic overview of AHA guidelines for community CPR. Role-play of emergencies allow participants the opportunity to practice calling for help and chest compression technique with discussion of AED use.   Hypertension: -Group verbal and written instruction that provides a basic overview of hypertension including the most recent diagnostic guidelines, risk factor reduction with self-care instructions and medication management.    Nutrition I class: Heart Healthy Eating:  -Group instruction provided by PowerPoint slides, verbal discussion, and written materials to support subject matter. The instructor gives an explanation and review of the Therapeutic Lifestyle Changes diet recommendations, which includes a discussion on lipid goals, dietary fat, sodium, fiber, plant stanol/sterol esters, sugar, and the components of a well-balanced, healthy diet.   Nutrition II class: Lifestyle Skills:  -Group instruction provided by PowerPoint slides, verbal discussion, and written materials to support subject matter. The instructor gives an explanation and review of label reading, grocery shopping for heart health, heart healthy recipe modifications, and ways to make healthier choices when eating out.   Diabetes Question & Answer:  -Group instruction provided by PowerPoint slides, verbal discussion, and written materials to support subject matter. The instructor gives an explanation and review of diabetes co-morbidities, pre- and post-prandial blood glucose goals, pre-exercise blood glucose goals, signs, symptoms, and treatment of  hypoglycemia and hyperglycemia, and foot care basics.   Diabetes Blitz:  -Group instruction provided by PowerPoint slides, verbal discussion, and written materials to support subject matter. The instructor gives an explanation and review of the physiology behind type 1 and type 2 diabetes, diabetes medications and rational behind using different medications, pre- and post-prandial blood glucose recommendations and Hemoglobin A1c goals, diabetes diet, and exercise including blood glucose guidelines for exercising safely.    Portion Distortion:  -Group instruction provided by PowerPoint slides, verbal discussion, written materials, and food models to support subject matter. The instructor gives an explanation of serving size versus portion size, changes in portions sizes over the last 20 years, and what consists of a serving from each food group.   Stress Management:  -Group instruction provided by verbal instruction, video, and written materials to support subject matter.  Instructors review role of stress in heart disease and how to cope with stress positively.     Exercising on Your Own:  -Group instruction provided by verbal instruction, power point, and written materials to support subject.  Instructors discuss benefits of exercise, components of exercise, frequency and intensity of exercise, and end points for exercise.  Also discuss use of nitroglycerin and activating EMS.  Review options of places to exercise outside of rehab.  Review guidelines for sex with heart disease.   Cardiac Drugs I:  -Group instruction provided by verbal instruction and written materials to support subject.  Instructor reviews cardiac drug classes: antiplatelets, anticoagulants, beta blockers, and statins.  Instructor discusses reasons, side effects, and lifestyle considerations for each drug class.  Cardiac Drugs II:  -Group instruction provided by verbal instruction and written materials to support subject.   Instructor reviews cardiac drug classes: angiotensin converting enzyme inhibitors (ACE-I), angiotensin II receptor blockers (ARBs), nitrates, and calcium channel blockers.  Instructor discusses reasons, side effects, and lifestyle considerations for each drug class.   Anatomy and Physiology of the Circulatory System:  Group verbal and written instruction and models provide basic cardiac anatomy and physiology, with the coronary electrical and arterial systems. Review of: AMI, Angina, Valve disease, Heart Failure, Peripheral Artery Disease, Cardiac Arrhythmia, Pacemakers, and the ICD.   Other Education:  -Group or individual verbal, written, or video instructions that support the educational goals of the cardiac rehab program.   Holiday Eating Survival Tips:  -Group instruction provided by PowerPoint slides, verbal discussion, and written materials to support subject matter. The instructor gives patients tips, tricks, and techniques to help them not only survive but enjoy the holidays despite the onslaught of food that accompanies the holidays.   Knowledge Questionnaire Score: Knowledge Questionnaire Score - 01/09/19 1058      Knowledge Questionnaire Score   Pre Score  18/24       Core Components/Risk Factors/Patient Goals at Admission: Personal Goals and Risk Factors at Admission - 01/09/19 1059      Core Components/Risk Factors/Patient Goals on Admission    Weight Management  Yes;Weight Maintenance    Intervention  Weight Management: Develop a combined nutrition and exercise program designed to reach desired caloric intake, while maintaining appropriate intake of nutrient and fiber, sodium and fats, and appropriate energy expenditure required for the weight goal.;Weight Management: Provide education and appropriate resources to help participant work on and attain dietary goals.    Admit Weight  177 lb 0.5 oz (80.3 kg)    Expected Outcomes  Short Term: Continue to assess and modify  interventions until short term weight is achieved;Long Term: Adherence to nutrition and physical activity/exercise program aimed toward attainment of established weight goal;Weight Maintenance: Understanding of the daily nutrition guidelines, which includes 25-35% calories from fat, 7% or less cal from saturated fats, less than 200mg  cholesterol, less than 1.5gm of sodium, & 5 or more servings of fruits and vegetables daily;Understanding recommendations for meals to include 15-35% energy as protein, 25-35% energy from fat, 35-60% energy from carbohydrates, less than 200mg  of dietary cholesterol, 20-35 gm of total fiber daily;Understanding of distribution of calorie intake throughout the day with the consumption of 4-5 meals/snacks    Diabetes  Yes    Intervention  Provide education about signs/symptoms and action to take for hypo/hyperglycemia.;Provide education about proper nutrition, including hydration, and aerobic/resistive exercise prescription along with prescribed medications to achieve blood glucose in normal ranges: Fasting glucose 65-99 mg/dL    Expected Outcomes  Short Term: Participant verbalizes understanding of the signs/symptoms and immediate care of hyper/hypoglycemia, proper foot care and importance of medication, aerobic/resistive exercise and nutrition plan for blood glucose control.;Long Term: Attainment of HbA1C < 7%.    Hypertension  Yes    Intervention  Provide education on lifestyle modifcations including regular physical activity/exercise, weight management, moderate sodium restriction and increased consumption of fresh fruit, vegetables, and low fat dairy, alcohol moderation, and smoking cessation.;Monitor prescription use compliance.    Expected Outcomes  Short Term: Continued assessment and intervention until BP is < 140/21mm HG in hypertensive participants. < 130/79mm HG in hypertensive participants with diabetes, heart failure or chronic kidney disease.;Long Term: Maintenance of  blood pressure at goal levels.    Lipids  Yes    Intervention  Provide education and support for participant on nutrition & aerobic/resistive exercise along with prescribed medications to achieve LDL 70mg , HDL >40mg .    Expected Outcomes  Short Term: Participant states understanding of desired cholesterol values and is compliant with medications prescribed. Participant is following exercise prescription and nutrition guidelines.;Long Term: Cholesterol controlled with medications as prescribed, with individualized exercise RX and with personalized nutrition plan. Value goals: LDL < 70mg , HDL > 40 mg.    Stress  Yes    Intervention  Offer individual and/or small group education and counseling on adjustment to heart disease, stress management and health-related lifestyle change. Teach and support self-help strategies.;Refer participants experiencing significant psychosocial distress to appropriate mental health specialists for further evaluation and treatment. When possible, include family members and significant others in education/counseling sessions.    Expected Outcomes  Short Term: Participant demonstrates changes in health-related behavior, relaxation and other stress management skills, ability to obtain effective social support, and compliance with psychotropic medications if prescribed.;Long Term: Emotional wellbeing is indicated by absence of clinically significant psychosocial distress or social isolation.       Core Components/Risk Factors/Patient Goals Review:    Core Components/Risk Factors/Patient Goals at Discharge (Final Review):    ITP Comments: ITP Comments    Row Name 01/09/19 1104           ITP Comments  Dr. Fransico Him, Medical Director          Comments: Patient attended orientation from 0830 to [phone] to review rules and guidelines for program. Completed 6 minute walk test, Intitial ITP, and exercise prescription.  VSS. Telemetry-SR/ST.  Asymptomatic.

## 2019-01-10 NOTE — Progress Notes (Signed)
[name] 68 y.o. male DOB: 03-Feb-1951 MRN: [id number]      Nutrition Note  1. Status post coronary artery stent placement    Past Medical History:  Diagnosis Date  . Coronary artery disease CARDIOLOGIST- DR [name]  . Diabetes mellitus without complication ([facility]) 7253   oral,insulin management  . History of acute inferior wall myocardial infarction    03-01-2012  STEMI  . History of kidney stones   . History of MI (myocardial infarction) 1998    . S/P drug eluting coronary stent placement   . Ureteral calculi left   Meds reviewed.   Current Outpatient Medications (Endocrine & Metabolic):  [name]  LEVEMIR FLEXTOUCH 100 UNIT/ML Pen, Inject 18-28 Units into the skin 2 (two) times daily. Taking 18 units in the AM and 28 units at night. .  metFORMIN (GLUCOPHAGE) 500 MG tablet, Take 1,000 mg by mouth 2 (two) times daily with a meal.   Current Outpatient Medications (Cardiovascular):  .  atorvastatin (LIPITOR) 80 MG tablet, Take 1 tablet (80 mg total) by mouth daily. [name]  losartan (COZAAR) 50 MG tablet, Take 1 tablet (50 mg total) by mouth daily. .  metoprolol tartrate (LOPRESSOR) 50 MG tablet, TAKE ONE TABLET BY MOUTH TWICE DAILY  .  nitroGLYCERIN (NITROSTAT) 0.4 MG SL tablet, Place 1 tablet (0.4 mg total) under the tongue every 5 (five) minutes x 3 doses as needed for chest pain.   Current Outpatient Medications (Analgesics):  .  aspirin EC 81 MG tablet, Take 1 tablet (81 mg total) by mouth daily.  Current Outpatient Medications (Hematological):  .  clopidogrel (PLAVIX) 75 MG tablet, Take 1 tablet (75 mg total) by mouth daily with breakfast.  Current Outpatient Medications (Other):  .  calcium carbonate (TUMS EX) 750 MG chewable tablet, Chew 2 tablets by mouth as needed for heartburn. .  fish oil-omega-3 fatty acids 1000 MG capsule, Take 4 g by mouth daily.  .  Multiple Vitamin (MULTIVITAMIN) tablet, Take 1 tablet by mouth daily. .  Turmeric 500 MG CAPS, Take 500 mg by mouth as  needed (joint pain).   HT: Ht Readings from Last 1 Encounters:  01/09/19 6\' 3"  (1.905 m)    WT: Wt Readings from Last 5 Encounters:  01/09/19 177 lb 0.5 oz (80.3 kg)  11/25/18 180 lb (81.6 kg)  11/12/18 171 lb 15.3 oz (78 kg)  11/12/17 182 lb 6.4 oz (82.7 kg)  10/17/16 177 lb 6.4 oz (80.5 kg)     Body mass index is 22.13 kg/m.   Current tobacco use? No  Labs:  Lipid Panel     Component Value Date/Time   CHOL 112 11/11/2018 0021   TRIG 266 (H) 11/11/2018 0021   HDL 29 (L) 11/11/2018 0021   CHOLHDL 3.9 11/11/2018 0021   VLDL 53 (H) 11/11/2018 0021   LDLCALC 30 11/11/2018 0021    Lab Results  Component Value Date   HGBA1C (H) 03/04/2011    8.8 (NOTE)                                                                       According to the ADA Clinical Practice Recommendations for 2011, when HbA1c is used as a screening test:   >=6.5%  Diagnostic of Diabetes Mellitus           (if abnormal result  is confirmed)  5.7-6.4%   Increased risk of developing Diabetes Mellitus  References:Diagnosis and Classification of Diabetes Mellitus,Diabetes QVZD,6387,56(EPPIR 1):S62-S69 and Standards of Medical Care in         Diabetes - 2011,Diabetes Care,2011,34  (Suppl 1):S11-S61.   CBG (last 3)  No results for input(s): GLUCAP in the last 72 hours.  Nutrition Note Spoke with pt. Nutrition plan and goals reviewed with pt. Pt is following Step 2 of the Therapeutic Lifestyle Changes diet. Pt wants to lose wt. Pt has not been trying to lose wt. Heart healthy diabetic wt loss tips reviewed (label reading, how to build a healthy plate, portion sizes, eating frequently across the day, how to count carbs, consistent carbohydrates across the day and week). Pt resistant to RD's recommendations of decreasing meals eaten away from home, quick and easy meal ideas, meal prep, weighing and measuring foods. Will continue to use motivational interviewing/ education to highlight discrepancy between pts goals  (weight loss) and current behaviors. Pt has Type 2 Diabetes. Last A1c indicates blood glucose not optimally -controlled (from 2012), don't have any recent A1c test results on pt. This Probation officer went over Diabetes Education test results. Pt checks CBG's 3 times a day. Fasting CBG's reportedly 120-140 mg/dL. Per discussion, pt does use canned/convenience foods often. Pt does not add salt to food. Pt does eat out frequently. Pt expressed understanding of the information reviewed. Pt aware of nutrition education classes offered and would like to attend nutrition classes.  Nutrition Diagnosis ? Food-and nutrition-related knowledge deficit related to lack of exposure to information as related to diagnosis of: ? CVD ? Type 2 Diabetes  Nutrition Intervention ? Pt's individual nutrition plan and goals reviewed with pt. ? Pt given handouts for: ? Nutrition I class ? Nutrition II class  ? Diabetes Blitz Class ? Diabetes Q & A class   Nutrition Goal(s):  ? Pt to identify and limit food sources of saturated fat, trans fat, refined carbohydrates and sodium ? Pt to identify food quantities necessary to achieve weight loss of 6-24 lb at graduation from cardiac rehab.  ? Pt to build a healthy plate including appropriate portion sizes of vegetables, fruits, whole grains, and lean proteins in a heart healthy meal plan. ? Pt able to name foods that affect blood glucose  Plan:  ? Pt to attend nutrition classes:  ? Nutrition I ? Nutrition II ? Portion Distortion  ? Diabetes Blitz ? Diabetes Q & Ae determined ? Will provide client-centered nutrition education as part of interdisciplinary care ? Monitor and evaluate progress toward nutrition goal with team.   Laurina Bustle, MS, RD, LDN 01/10/2019 10:56 AM

## 2019-01-13 ENCOUNTER — Encounter (HOSPITAL_COMMUNITY)
Admission: RE | Admit: 2019-01-13 | Discharge: 2019-01-13 | Disposition: A | Discharge status: Home / Self Care | Payer: Medicare Other | Source: Ambulatory Visit | Attending: Interventional Cardiology | Admitting: Interventional Cardiology

## 2019-01-13 ENCOUNTER — Ambulatory Visit (HOSPITAL_COMMUNITY): Payer: Medicare Other

## 2019-01-13 ENCOUNTER — Encounter (HOSPITAL_COMMUNITY): Payer: Self-pay

## 2019-01-13 DIAGNOSIS — Z955 Presence of coronary angioplasty implant and graft: Secondary | ICD-10-CM | POA: Diagnosis not present

## 2019-01-13 LAB — GLUCOSE, CAPILLARY
Glucose-Capillary: 192 mg/dL — ABNORMAL HIGH (ref 70–99)
Glucose-Capillary: 158 mg/dL — ABNORMAL HIGH (ref 70–99)

## 2019-01-13 NOTE — Progress Notes (Signed)
Cardiac Rehab Medication Review by a Pharmacist  Does the patient  feel that his/her medications are working for him/her?  yes  Has the patient been experiencing any side effects to the medications prescribed?  yes  Does the patient measure his/her own blood pressure or blood glucose at home?  yes   Does the patient have any problems obtaining medications due to transportation or finances?   no  Understanding of regimen: good Understanding of indications: good Potential of compliance: good    RN comments:  Pt with good understanding of medication regimen. Pt has no identified barriers to medication compliance.  Pt educated about necessity and safety of eating when taking medications specifically levemir and metformin.  Pt verbalizes he has maintained a habit of eating 1 time daily for many years.  RD will consult with pt. Will continue to monitor.     Nancie Neas Rion 01/13/2019 9:59 AM

## 2019-01-13 NOTE — Progress Notes (Signed)
Daily Session Note  Patient Details  Name: [name] MRN: [id number] Date of Birth: [birth year] Referring Provider:   Flowsheet Row CARDIAC REHAB PHASE II ORIENTATION from 01/09/2019 in [place]  Referring Provider  Dr. [name]      Encounter Date: 01/13/2019  Check In: Session Check In - 01/13/19 0844    Check-In          Supervising physician immediately available to respond to emergencies  [facility] immediately available    Physician(s)  Dr. Waldron Labs    Location  [facility]    Staff Present  Dorma Russell, MS,ACSM CEP, Exercise Physiologist;Dugan Vanhoesen, RN, BSN;Molly DiVincenzo, MS, ACSM RCEP, Exercise Physiologist;Brittany Durene Fruits, BS, ACSM CEP, Exercise Physiologist    Medication changes reported      No    Fall or balance concerns reported     No    Tobacco Cessation  No Change    Warm-up and Cool-down  Performed as group-led instruction    Resistance Training Performed  Yes    VAD Patient?  No    PAD/SET Patient?  No        Pain Assessment          Currently in Pain?  No/denies    Multiple Pain Sites  No           Capillary Blood Glucose: Results for orders placed or performed during the hospital encounter of 01/13/19 (from the past 24 hour(s))  Glucose, capillary     Status: Abnormal   Collection Time: 01/13/19  8:16 AM  Result Value Ref Range   Glucose-Capillary 192 (H) 70 - 99 mg/dL  Glucose, capillary     Status: Abnormal   Collection Time: 01/13/19  9:12 AM  Result Value Ref Range   Glucose-Capillary 158 (H) 70 - 99 mg/dL    Exercise Prescription Changes - 01/13/19 1000    Response to Exercise          Blood Pressure (Admit)  136/84    Blood Pressure (Exercise)  130/74    Blood Pressure (Exit)  124/72    Heart Rate (Admit)  80 bpm    Heart Rate (Exercise)  112 bpm    Heart Rate (Exit)  86 bpm    Rating of Perceived Exertion (Exercise)  12    Comments  Pt first day of exercise.     Duration  Progress to 30 minutes of  aerobic without signs/symptoms of physical distress    Intensity  THRR unchanged        Progression          Progression  Continue to progress workloads to maintain intensity without signs/symptoms of physical distress.    Average METs  3.2        Resistance Training          Training Prescription  Yes    Weight  4 lbs.     Reps  10-15    Time  10 Minutes        Bike          Level  1    Minutes  10    METs  3.36        NuStep          Level  3    SPM  85    Minutes  10    METs  3        Track  Laps  12    Minutes  10    METs  3.07           Social History   Tobacco Use  Smoking Status Former Smoker  . Last attempt to quit: 02/12/1989  . Years since quitting: 29.9  Smokeless Tobacco Never Used    Goals Met:  Exercise tolerated well  Goals Unmet:  Not Applicable  Comments: Pt started cardiac rehab today.  Pt tolerated light exercise without difficulty. VSS, telemetry-sinus rhythm,  asymptomatic.  Medication list reconciled. Pt denies barriers to medicaiton compliance.  PSYCHOSOCIAL ASSESSMENT:  PHQ-0.  Pt exhibits positive coping skills, hopeful outlook with supportive family. No psychosocial needs identified at this time, no psychosocial interventions necessary. Pt oriented to exercise equipment and routine.  Understanding verbalized.  Andi Hence, RN, BSN Cardiac Pulmonary Rehab 01/13/19 10:13 AM    Dr. Fransico Him is Medical Director for Cardiac Rehab at [facility].

## 2019-01-15 ENCOUNTER — Ambulatory Visit (HOSPITAL_COMMUNITY): Payer: Medicare Other

## 2019-01-15 ENCOUNTER — Encounter (HOSPITAL_COMMUNITY)
Admission: RE | Admit: 2019-01-15 | Discharge: 2019-01-15 | Disposition: A | Discharge status: Home / Self Care | Payer: Medicare Other | Source: Ambulatory Visit | Attending: Interventional Cardiology | Admitting: Interventional Cardiology

## 2019-01-15 DIAGNOSIS — E049 Nontoxic goiter, unspecified: Secondary | ICD-10-CM | POA: Diagnosis not present

## 2019-01-15 DIAGNOSIS — I10 Essential (primary) hypertension: Secondary | ICD-10-CM | POA: Diagnosis not present

## 2019-01-15 DIAGNOSIS — E1165 Type 2 diabetes mellitus with hyperglycemia: Secondary | ICD-10-CM | POA: Diagnosis not present

## 2019-01-15 DIAGNOSIS — Z955 Presence of coronary angioplasty implant and graft: Secondary | ICD-10-CM

## 2019-01-15 DIAGNOSIS — E7800 Pure hypercholesterolemia, unspecified: Secondary | ICD-10-CM | POA: Diagnosis not present

## 2019-01-15 LAB — GLUCOSE, CAPILLARY: Glucose-Capillary: 149 mg/dL — ABNORMAL HIGH (ref 70–99)

## 2019-01-16 NOTE — Progress Notes (Signed)
Cardiac Individual Treatment Plan  Patient Details  Name: [name] MRN: [id number] Date of Birth: 01-Mar-1951 Referring Provider:   Flowsheet Row CARDIAC REHAB PHASE II ORIENTATION from 01/09/2019 in [place]  Referring Provider  Dr. [name]      Initial Encounter Date:  Flowsheet Row CARDIAC REHAB PHASE II ORIENTATION from 01/09/2019 in [place]  Date  01/09/19      Visit Diagnosis: Status post coronary artery stent placement  Patient's Home Medications on Admission:  Current Outpatient Medications:  .  aspirin EC 81 MG tablet, Take 1 tablet (81 mg total) by mouth daily., Disp: 90 tablet, Rfl: 3 .  atorvastatin (LIPITOR) 80 MG tablet, Take 1 tablet (80 mg total) by mouth daily., Disp: 90 tablet, Rfl: 4 .  calcium carbonate (TUMS EX) 750 MG chewable tablet, Chew 2 tablets by mouth as needed for heartburn., Disp: , Rfl:  .  clopidogrel (PLAVIX) 75 MG tablet, Take 1 tablet (75 mg total) by mouth daily with breakfast., Disp: 90 tablet, Rfl: 4 .  fish oil-omega-3 fatty acids 1000 MG capsule, Take 4 g by mouth daily. , Disp: , Rfl:  .  LEVEMIR FLEXTOUCH 100 UNIT/ML Pen, Inject 18-28 Units into the skin 2 (two) times daily. Taking 18 units in the AM and 28 units at night., Disp: , Rfl:  .  losartan (COZAAR) 50 MG tablet, Take 1 tablet (50 mg total) by mouth daily., Disp: 90 tablet, Rfl: 4 .  metFORMIN (GLUCOPHAGE) 500 MG tablet, Take 1,000 mg by mouth 2 (two) times daily with a meal. , Disp: , Rfl:  .  metoprolol tartrate (LOPRESSOR) 50 MG tablet, TAKE ONE TABLET BY MOUTH TWICE DAILY , Disp: 180 tablet, Rfl: 3 .  Multiple Vitamin (MULTIVITAMIN) tablet, Take 1 tablet by mouth daily., Disp: , Rfl:  .  nitroGLYCERIN (NITROSTAT) 0.4 MG SL tablet, Place 1 tablet (0.4 mg total) under the tongue every 5 (five) minutes x 3 doses as needed for chest pain., Disp: 25 tablet, Rfl: 0 .  Turmeric 500 MG CAPS, Take 500 mg by mouth as  needed (joint pain)., Disp: , Rfl:   Past Medical History: Past Medical History:  Diagnosis Date  . Coronary artery disease CARDIOLOGIST- DR [name]  . Diabetes mellitus without complication ([place]) 0093   oral,insulin management  . History of acute inferior wall myocardial infarction    03-01-2012  STEMI  . History of kidney stones   . History of MI (myocardial infarction) 1998    . S/P drug eluting coronary stent placement   . Ureteral calculi left    Tobacco Use: Social History   Tobacco Use  Smoking Status Former Smoker  . Last attempt to quit: 02/12/1989  . Years since quitting: 29.9  Smokeless Tobacco Never Used    Labs: Recent Review Flowsheet Data    Labs for ITP Cardiac and Pulmonary Rehab Latest Ref Rng & Units 03/02/2011 03/04/2011 03/09/2014 08/26/2015 11/11/2018   Cholestrol 0 - 200 mg/dL - - 160 131 112   LDLCALC 0 - 99 mg/dL - - 98 73 30   HDL >40 mg/dL - - 38.50(L) 41.40 29(L)   Trlycerides <150 mg/dL - - 116.0 82.0 266(H)   Hemoglobin A1c <5.7 % 8.4 (NOTE)  According to the ADA Clinical Practice Recommendations for 2011, when HbA1c is used as a screening test:   >=6.5%   Diagnostic of Diabetes Mellitus           (if abnormal result is confirmed)  5.7-6.4%   Increased risk of developing Diabetes Mellitus  References:Diagnosis and Classification of Diabetes Mellitus,Diabetes XTKW,4097,35(HGDJM 1):S62-S69 and Standards of Medical Care in         Diabetes - 2011,Diabetes EQAS,3419,62 (Suppl 1):S11-S61.(H) 8.8 (NOTE)                                                                       According to the ADA Clinical Practice Recommendations for 2011, when HbA1c is used as a screening test:   >=6.5%   Diagnostic of Diabetes Mellitus           (if abnormal result is confirmed)  5.7-6.4%   Increased risk of developing Diabetes Mellitus  References:Diagnosis and Classification of Diabetes Mellitus,Diabetes  IWLN,9892,11(HERDE 1):S62-S69 and Standards of Medical Care in         Diabetes - 2011,Diabetes YCXK,4818,56 (Suppl 1):S11-S61.(H) - - -   TCO2 0 - 100 mmol/L 22 - - - -      Capillary Blood Glucose: Lab Results  Component Value Date   GLUCAP 149 (H) 01/15/2019   GLUCAP 158 (H) 01/13/2019   GLUCAP 192 (H) 01/13/2019   GLUCAP 126 (H) 11/12/2018   GLUCAP 115 (H) 11/12/2018     Exercise Target Goals: Exercise Program Goal: Individual exercise prescription set using results from initial 6 min walk test and THRR while considering  patient's activity barriers and safety.   Exercise Prescription Goal: Initial exercise prescription builds to 30-45 minutes a day of aerobic activity, 2-3 days per week.  Home exercise guidelines will be given to patient during program as part of exercise prescription that the participant will acknowledge.  Activity Barriers & Risk Stratification: Activity Barriers & Cardiac Risk Stratification - 01/09/19 1100    Activity Barriers & Cardiac Risk Stratification          Activity Barriers  Joint Problems    Cardiac Risk Stratification  Moderate           6 Minute Walk: 6 Minute Walk    6 Minute Walk    Row Name 01/09/19 1100   Phase  Initial   Distance  1671 feet   Walk Time  6 minutes   # of Rest Breaks  0   MPH  3.2   METS  4.56   RPE  11   VO2 Peak  15.97   Symptoms  Yes (comment)   Comments  Hip pain.    Resting HR  90 bpm   Resting BP  118/78   Resting Oxygen Saturation   98 %   Exercise Oxygen Saturation  during 6 min walk  99 %   Max Ex. HR  107 bpm   Max Ex. BP  152/80   2 Minute Post BP  122/70          Oxygen Initial Assessment:   Oxygen Re-Evaluation:   Oxygen Discharge (Final Oxygen Re-Evaluation):   Initial Exercise Prescription: Initial Exercise Prescription - 01/09/19 1100    Date of Initial Exercise  RX and Referring Provider          Date  01/09/19    Referring Provider  Dr. [name]    Expected Discharge  Date  04/18/19        Bike          Level  1    Minutes  10    METs  3.36        NuStep          Level  3    SPM  85    Minutes  10    METs  4.4        Track          Laps  12    Minutes  10    METs  3.07        Prescription Details          Frequency (times per week)  3    Duration  Progress to 30 minutes of continuous aerobic without signs/symptoms of physical distress        Intensity          THRR 40-80% of Max Heartrate  62-124    Ratings of Perceived Exertion  11-13        Progression          Progression  Continue to progress workloads to maintain intensity without signs/symptoms of physical distress.        Resistance Training          Training Prescription  Yes    Weight  4 lbs.     Reps  10-15           Perform Capillary Blood Glucose checks as needed.  Exercise Prescription Changes: Exercise Prescription Changes    Response to Exercise    Row Name 01/13/19 1000   Blood Pressure (Admit)  136/84   Blood Pressure (Exercise)  130/74   Blood Pressure (Exit)  124/72   Heart Rate (Admit)  80 bpm   Heart Rate (Exercise)  112 bpm   Heart Rate (Exit)  86 bpm   Rating of Perceived Exertion (Exercise)  12   Comments  Pt first day of exercise.    Duration  Progress to 30 minutes of  aerobic without signs/symptoms of physical distress   Intensity  THRR unchanged       Progression    Row Name 01/13/19 1000   Progression  Continue to progress workloads to maintain intensity without signs/symptoms of physical distress.   Average METs  3.2       Resistance Training    Row Name 01/13/19 1000   Training Prescription  Yes   Weight  4 lbs.    Reps  10-15   Time  [place] Name 01/13/19 1000   Level  1   Minutes  10   METs  3.36       NuStep    Row Name 01/13/19 1000   Level  3   SPM  85   Minutes  10   METs  3       Track    Row Name 01/13/19 1000   Laps  12   Minutes  10   METs  3.07           Exercise Comments: Exercise Comments    Row Name 01/13/19 1013   Exercise Comments  Pt first day of exercise. Tolerated exercise well.  Exercise Goals and Review: Exercise Goals    Exercise Goals    Row Name 01/09/19 1101   Increase Physical Activity  Yes   Intervention  Provide advice, education, support and counseling about physical activity/exercise needs.;Develop an individualized exercise prescription for aerobic and resistive training based on initial evaluation findings, risk stratification, comorbidities and participant's personal goals.   Expected Outcomes  Short Term: Attend rehab on a regular basis to increase amount of physical activity.;Long Term: Add in home exercise to make exercise part of routine and to increase amount of physical activity.;Long Term: Exercising regularly at least 3-5 days a week.   Increase Strength and Stamina  Yes   Intervention  Provide advice, education, support and counseling about physical activity/exercise needs.;Develop an individualized exercise prescription for aerobic and resistive training based on initial evaluation findings, risk stratification, comorbidities and participant's personal goals.   Expected Outcomes  Short Term: Increase workloads from initial exercise prescription for resistance, speed, and METs.   Able to understand and use rate of perceived exertion (RPE) scale  Yes   Intervention  Provide education and explanation on how to use RPE scale   Expected Outcomes  Short Term: Able to use RPE daily in rehab to express subjective intensity level;Long Term:  Able to use RPE to guide intensity level when exercising independently   Knowledge and understanding of Target Heart Rate Range (THRR)  Yes   Intervention  Provide education and explanation of THRR including how the numbers were predicted and where they are located for reference   Expected Outcomes  Short Term: Able to state/look up THRR;Long Term: Able to use THRR to  govern intensity when exercising independently;Short Term: Able to use daily as guideline for intensity in rehab   Able to check pulse independently  Yes   Intervention  Provide education and demonstration on how to check pulse in carotid and radial arteries.;Review the importance of being able to check your own pulse for safety during independent exercise   Expected Outcomes  Short Term: Able to explain why pulse checking is important during independent exercise;Long Term: Able to check pulse independently and accurately   Understanding of Exercise Prescription  Yes   Intervention  Provide education, explanation, and written materials on patient's individual exercise prescription   Expected Outcomes  Short Term: Able to explain program exercise prescription;Long Term: Able to explain home exercise prescription to exercise independently          Exercise Goals Re-Evaluation : Exercise Goals Re-Evaluation    Exercise Goal Re-Evaluation    Row Name 01/13/19 1012   Exercise Goals Review  Increase Physical Activity;Increase Strength and Stamina;Able to understand and use rate of perceived exertion (RPE) scale;Knowledge and understanding of Target Heart Rate Range (THRR);Understanding of Exercise Prescription   Comments  Pt first day of exercise. Pt tolerated exercise well and understands RPE scale, THRR, and exercise Rx.    Expected Outcomes  Will continue to monitor and progress Pt as tolerated.           Discharge Exercise Prescription (Final Exercise Prescription Changes): Exercise Prescription Changes - 01/13/19 1000    Response to Exercise          Blood Pressure (Admit)  136/84    Blood Pressure (Exercise)  130/74    Blood Pressure (Exit)  124/72    Heart Rate (Admit)  80 bpm    Heart Rate (Exercise)  112 bpm    Heart Rate (Exit)  86 bpm  Rating of Perceived Exertion (Exercise)  12    Comments  Pt first day of exercise.     Duration  Progress to 30 minutes of  aerobic without  signs/symptoms of physical distress    Intensity  THRR unchanged        Progression          Progression  Continue to progress workloads to maintain intensity without signs/symptoms of physical distress.    Average METs  3.2        Resistance Training          Training Prescription  Yes    Weight  4 lbs.     Reps  10-15    Time  10 Minutes        Bike          Level  1    Minutes  10    METs  3.36        NuStep          Level  3    SPM  85    Minutes  10    METs  3        Track          Laps  12    Minutes  10    METs  3.07           Nutrition:  Target Goals: Understanding of nutrition guidelines, daily intake of sodium 1500mg , cholesterol 200mg , calories 30% from fat and 7% or less from saturated fats, daily to have 5 or more servings of fruits and vegetables.  Biometrics: Pre Biometrics - 01/09/19 1101    Pre Biometrics          Height  6\' 3"  (1.905 m)    Weight  80.3 kg    Waist Circumference  35 inches    Hip Circumference  39 inches    Waist to Hip Ratio  0.9 %    BMI (Calculated)  22.13    Triceps Skinfold  25 mm    % Body Fat  24.7 %    Grip Strength  36 kg    Flexibility  0 in    Single Leg Stand  3.12 seconds            Nutrition Therapy Plan and Nutrition Goals: Nutrition Therapy & Goals - 01/09/19 1045    Nutrition Therapy          Diet  heart healthy, carb modified        Personal Nutrition Goals          Nutrition Goal  Pt to identify and limit food sources of saturated fat, trans fat, refined carbohydrates and sodium    Personal Goal #2  Pt to build a healthy plate including appropriate portion sizes of vegetables, fruits, whole grains, and lean protein in a heart healthy meal plan.    Personal Goal #3  Pt able to name foods that affect blood glucose    Personal Goal #4  Pt to identify food quantities necessary to achieve weight loss of 6-24 lb at graduation from cardiac rehab.         Intervention Plan           Intervention  Prescribe, educate and counsel regarding individualized specific dietary modifications aiming towards targeted core components such as weight, hypertension, lipid management, diabetes, heart failure and other comorbidities.    Expected Outcomes  Short Term Goal: Understand basic principles of dietary content, such as  calories, fat, sodium, cholesterol and nutrients.;Long Term Goal: Adherence to prescribed nutrition plan.           Nutrition Assessments: Nutrition Assessments - 01/09/19 1008    MEDFICTS Scores          Pre Score  26           Nutrition Goals Re-Evaluation: Nutrition Goals Re-Evaluation    Goals    Row Name 01/09/19 1045   Current Weight  177 lb 0.5 oz (80.3 kg)          Nutrition Goals Re-Evaluation: Nutrition Goals Re-Evaluation    Goals    Row Name 01/09/19 1045   Current Weight  177 lb 0.5 oz (80.3 kg)          Nutrition Goals Discharge (Final Nutrition Goals Re-Evaluation): Nutrition Goals Re-Evaluation - 01/09/19 1045    Goals          Current Weight  177 lb 0.5 oz (80.3 kg)           Psychosocial: Target Goals: Acknowledge presence or absence of significant depression and/or stress, maximize coping skills, provide positive support system. Participant is able to verbalize types and ability to use techniques and skills needed for reducing stress and depression.  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         Family Dynamics          Good Support System?  Yes   Pt lists his family and church as sources of support.    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        Barriers          Psychosocial barriers to participate in program  The patient should benefit from  training in stress management and relaxation.        Screening Interventions          Interventions  Encouraged to exercise    Expected Outcomes  Short Term goal: Utilizing psychosocial counselor, staff and physician to assist with identification of specific Stressors or current issues interfering with healing process. Setting desired goal for each stressor or current issue identified.;Long Term Goal: Stressors or current issues are controlled or eliminated.;Short Term goal: Identification and review with participant of any Quality of Life or Depression concerns found by scoring the questionnaire.;Long Term goal: The participant improves quality of Life and PHQ9 Scores as seen by post scores and/or verbalization of changes           Quality of Life Scores: Quality of Life - 01/09/19 1058    Quality of Life          Select  Quality of Life        Quality of Life Scores          Health/Function Pre  20 %    Socioeconomic Pre  21.71 %    Psych/Spiritual Pre  12.93 %    Family Pre  15.1 %    GLOBAL Pre  18.18 %          Scores of 19 and below usually indicate a poorer quality of life in these areas.  A  difference of  2-3 points is a clinically meaningful difference.  A difference of 2-3 points in the total score of the Quality of Life Index has been associated with significant improvement in overall quality of life, self-image, physical symptoms, and general health in studies assessing change in quality of life.  PHQ-9: Recent Review Flowsheet Data    Depression screen [facility] 2/9 01/13/2019   Decreased Interest 0   Down, Depressed, Hopeless 0   PHQ - 2 Score 0     Interpretation of Total Score  Total Score Depression Severity:  1-4 = Minimal depression, 5-9 = Mild depression, 10-14 = Moderate depression, 15-19 = Moderately severe depression, 20-27 = Severe depression   Psychosocial Evaluation and Intervention: Psychosocial Evaluation - 01/13/19 1003    Psychosocial Evaluation &  Interventions          Interventions  Stress management education;Relaxation education;Encouraged to exercise with the program and follow exercise prescription    Comments  pt with remote history of reactive depression denies current depressive symptoms. PHQ-0 today. pt demonstrates good coping skills.  pt is widower.  pt enjoys bridge tournaments and participated in one this weekend.   will continue to screen for depression throughout program.     Expected Outcomes  pt will exhibit positive outlook with good coping skills.     Continue Psychosocial Services   Follow up required by staff           Psychosocial Re-Evaluation: Psychosocial Re-Evaluation    Psychosocial Re-Evaluation    [place] Name 01/16/19 1015   Current issues with  History of Depression   Comments  no psychosocial needs identified, no interventions necessary. pt with h/o depression encouraged to participate in stress and relaxation education.    Expected Outcomes  pt will exhibit positive outlook with good coping skills.    Interventions  Encouraged to attend Cardiac Rehabilitation for the exercise;Stress management education;Relaxation education   Continue Psychosocial Services   Follow up required by staff          Psychosocial Discharge (Final Psychosocial Re-Evaluation): Psychosocial Re-Evaluation - 01/16/19 1015    Psychosocial Re-Evaluation          Current issues with  History of Depression    Comments  no psychosocial needs identified, no interventions necessary. pt with h/o depression encouraged to participate in stress and relaxation education.     Expected Outcomes  pt will exhibit positive outlook with good coping skills.     Interventions  Encouraged to attend Cardiac Rehabilitation for the exercise;Stress management education;Relaxation education    Continue Psychosocial Services   Follow up required by staff           Vocational Rehabilitation: Provide vocational rehab assistance to qualifying  candidates.   Vocational Rehab Evaluation & Intervention: Vocational Rehab - 01/09/19 1217    Initial Vocational Rehab Evaluation & Intervention          Assessment shows need for Vocational Rehabilitation  No           Education: Education Goals: Education classes will be provided on a weekly basis, covering required topics. Participant will state understanding/return demonstration of topics presented.  Learning Barriers/Preferences: Learning Barriers/Preferences - 01/09/19 1058    Learning Barriers/Preferences          Learning Barriers  Sight    Learning Preferences  Written Material           Education Topics: Count Your Pulse:  -Group instruction provided by verbal instruction, demonstration, patient participation  and written materials to support subject.  Instructors address importance of being able to find your pulse and how to count your pulse when at home without a heart monitor.  Patients get hands on experience counting their pulse with staff help and individually.   Heart Attack, Angina, and Risk Factor Modification:  -Group instruction provided by verbal instruction, video, and written materials to support subject.  Instructors address signs and symptoms of angina and heart attacks.    Also discuss risk factors for heart disease and how to make changes to improve heart health risk factors.   Functional Fitness:  -Group instruction provided by verbal instruction, demonstration, patient participation, and written materials to support subject.  Instructors address safety measures for doing things around the house.  Discuss how to get up and down off the floor, how to pick things up properly, how to safely get out of a chair without assistance, and balance training.   Meditation and Mindfulness:  -Group instruction provided by verbal instruction, patient participation, and written materials to support subject.  Instructor addresses importance of mindfulness and  meditation practice to help reduce stress and improve awareness.  Instructor also leads participants through a meditation exercise.    Stretching for Flexibility and Mobility:  -Group instruction provided by verbal instruction, patient participation, and written materials to support subject.  Instructors lead participants through series of stretches that are designed to increase flexibility thus improving mobility.  These stretches are additional exercise for major muscle groups that are typically performed during regular warm up and cool down.   Hands Only CPR:  -Group verbal, video, and participation provides a basic overview of AHA guidelines for community CPR. Role-play of emergencies allow participants the opportunity to practice calling for help and chest compression technique with discussion of AED use.   Hypertension: -Group verbal and written instruction that provides a basic overview of hypertension including the most recent diagnostic guidelines, risk factor reduction with self-care instructions and medication management.    Nutrition I class: Heart Healthy Eating:  -Group instruction provided by PowerPoint slides, verbal discussion, and written materials to support subject matter. The instructor gives an explanation and review of the Therapeutic Lifestyle Changes diet recommendations, which includes a discussion on lipid goals, dietary fat, sodium, fiber, plant stanol/sterol esters, sugar, and the components of a well-balanced, healthy diet.   Nutrition II class: Lifestyle Skills:  -Group instruction provided by PowerPoint slides, verbal discussion, and written materials to support subject matter. The instructor gives an explanation and review of label reading, grocery shopping for heart health, heart healthy recipe modifications, and ways to make healthier choices when eating out.   Diabetes Question & Answer:  -Group instruction provided by PowerPoint slides, verbal discussion,  and written materials to support subject matter. The instructor gives an explanation and review of diabetes co-morbidities, pre- and post-prandial blood glucose goals, pre-exercise blood glucose goals, signs, symptoms, and treatment of hypoglycemia and hyperglycemia, and foot care basics.   Diabetes Blitz:  -Group instruction provided by PowerPoint slides, verbal discussion, and written materials to support subject matter. The instructor gives an explanation and review of the physiology behind type 1 and type 2 diabetes, diabetes medications and rational behind using different medications, pre- and post-prandial blood glucose recommendations and Hemoglobin A1c goals, diabetes diet, and exercise including blood glucose guidelines for exercising safely.    Portion Distortion:  -Group instruction provided by PowerPoint slides, verbal discussion, written materials, and food models to support subject matter. The instructor gives an  explanation of serving size versus portion size, changes in portions sizes over the last 20 years, and what consists of a serving from each food group.   Stress Management:  -Group instruction provided by verbal instruction, video, and written materials to support subject matter.  Instructors review role of stress in heart disease and how to cope with stress positively.     Exercising on Your Own:  -Group instruction provided by verbal instruction, power point, and written materials to support subject.  Instructors discuss benefits of exercise, components of exercise, frequency and intensity of exercise, and end points for exercise.  Also discuss use of nitroglycerin and activating EMS.  Review options of places to exercise outside of rehab.  Review guidelines for sex with heart disease.   Cardiac Drugs I:  -Group instruction provided by verbal instruction and written materials to support subject.  Instructor reviews cardiac drug classes: antiplatelets, anticoagulants, beta  blockers, and statins.  Instructor discusses reasons, side effects, and lifestyle considerations for each drug class.   Cardiac Drugs II:  -Group instruction provided by verbal instruction and written materials to support subject.  Instructor reviews cardiac drug classes: angiotensin converting enzyme inhibitors (ACE-I), angiotensin II receptor blockers (ARBs), nitrates, and calcium channel blockers.  Instructor discusses reasons, side effects, and lifestyle considerations for each drug class.   Anatomy and Physiology of the Circulatory System:  Group verbal and written instruction and models provide basic cardiac anatomy and physiology, with the coronary electrical and arterial systems. Review of: AMI, Angina, Valve disease, Heart Failure, Peripheral Artery Disease, Cardiac Arrhythmia, Pacemakers, and the ICD.   Other Education:  -Group or individual verbal, written, or video instructions that support the educational goals of the cardiac rehab program.   Holiday Eating Survival Tips:  -Group instruction provided by PowerPoint slides, verbal discussion, and written materials to support subject matter. The instructor gives patients tips, tricks, and techniques to help them not only survive but enjoy the holidays despite the onslaught of food that accompanies the holidays.   Knowledge Questionnaire Score: Knowledge Questionnaire Score - 01/09/19 1058    Knowledge Questionnaire Score          Pre Score  18/24           Core Components/Risk Factors/Patient Goals at Admission: Personal Goals and Risk Factors at Admission - 01/09/19 1059    Core Components/Risk Factors/Patient Goals on Admission           Weight Management  Yes;Weight Maintenance    Intervention  Weight Management: Develop a combined nutrition and exercise program designed to reach desired caloric intake, while maintaining appropriate intake of nutrient and fiber, sodium and fats, and appropriate energy expenditure  required for the weight goal.;Weight Management: Provide education and appropriate resources to help participant work on and attain dietary goals.    Admit Weight  177 lb 0.5 oz (80.3 kg)    Expected Outcomes  Short Term: Continue to assess and modify interventions until short term weight is achieved;Long Term: Adherence to nutrition and physical activity/exercise program aimed toward attainment of established weight goal;Weight Maintenance: Understanding of the daily nutrition guidelines, which includes 25-35% calories from fat, 7% or less cal from saturated fats, less than 200mg  cholesterol, less than 1.5gm of sodium, & 5 or more servings of fruits and vegetables daily;Understanding recommendations for meals to include 15-35% energy as protein, 25-35% energy from fat, 35-60% energy from carbohydrates, less than 200mg  of dietary cholesterol, 20-35 gm of total fiber daily;Understanding of distribution of calorie  intake throughout the day with the consumption of 4-5 meals/snacks    Diabetes  Yes    Intervention  Provide education about signs/symptoms and action to take for hypo/hyperglycemia.;Provide education about proper nutrition, including hydration, and aerobic/resistive exercise prescription along with prescribed medications to achieve blood glucose in normal ranges: Fasting glucose 65-99 mg/dL    Expected Outcomes  Short Term: Participant verbalizes understanding of the signs/symptoms and immediate care of hyper/hypoglycemia, proper foot care and importance of medication, aerobic/resistive exercise and nutrition plan for blood glucose control.;Long Term: Attainment of HbA1C < 7%.    Hypertension  Yes    Intervention  Provide education on lifestyle modifcations including regular physical activity/exercise, weight management, moderate sodium restriction and increased consumption of fresh fruit, vegetables, and low fat dairy, alcohol moderation, and smoking cessation.;Monitor prescription use compliance.     Expected Outcomes  Short Term: Continued assessment and intervention until BP is < 140/47mm HG in hypertensive participants. < 130/68mm HG in hypertensive participants with diabetes, heart failure or chronic kidney disease.;Long Term: Maintenance of blood pressure at goal levels.    Lipids  Yes    Intervention  Provide education and support for participant on nutrition & aerobic/resistive exercise along with prescribed medications to achieve LDL 70mg , HDL >40mg .    Expected Outcomes  Short Term: Participant states understanding of desired cholesterol values and is compliant with medications prescribed. Participant is following exercise prescription and nutrition guidelines.;Long Term: Cholesterol controlled with medications as prescribed, with individualized exercise RX and with personalized nutrition plan. Value goals: LDL < 70mg , HDL > 40 mg.    Stress  Yes    Intervention  Offer individual and/or small group education and counseling on adjustment to heart disease, stress management and health-related lifestyle change. Teach and support self-help strategies.;Refer participants experiencing significant psychosocial distress to appropriate mental health specialists for further evaluation and treatment. When possible, include family members and significant others in education/counseling sessions.    Expected Outcomes  Short Term: Participant demonstrates changes in health-related behavior, relaxation and other stress management skills, ability to obtain effective social support, and compliance with psychotropic medications if prescribed.;Long Term: Emotional wellbeing is indicated by absence of clinically significant psychosocial distress or social isolation.           Core Components/Risk Factors/Patient Goals Review:  Goals and Risk Factor Review    Core Components/Risk Factors/Patient Goals Review    Row Name 01/13/19 1007 01/16/19 1017   Personal Goals Review  Weight  Management/Obesity;Diabetes;Hypertension;Lipids;Stress  Weight Management/Obesity;Diabetes;Hypertension;Lipids;Stress   Review  pt with multiple CAD RF demonstrates tolerance to CR participation. pt demonstrates diabetes self care knowledge however needs nutritional reinforcement and guidance. pt persaonal goals are to maintain healthy weight and   pt with multiple CAD RF demonstrates tolerance to CR participation. pt demonstrates diabetes self care knowledge however needs nutritional reinforcement and guidance. pt persaonal goals are to maintain healthy weight and    Expected Outcomes  pt will participate in CR exercise, nutrition and lifestyle modification to decrease overall RF.   pt will participate in CR exercise, nutrition and lifestyle modification to decrease overall RF.           Core Components/Risk Factors/Patient Goals at Discharge (Final Review):  Goals and Risk Factor Review - 01/16/19 1017    Core Components/Risk Factors/Patient Goals Review          Personal Goals Review  Weight Management/Obesity;Diabetes;Hypertension;Lipids;Stress    Review  pt with multiple CAD RF demonstrates tolerance to CR participation. pt  demonstrates diabetes self care knowledge however needs nutritional reinforcement and guidance. pt persaonal goals are to maintain healthy weight and     Expected Outcomes  pt will participate in CR exercise, nutrition and lifestyle modification to decrease overall RF.            ITP Comments: ITP Comments    Row Name 01/09/19 1104 01/13/19 0936 01/16/19 1014   ITP Comments  Dr. Fransico Him, Medical Director  pt started group exercise session today. pt tolerated light activity without difficulty. pt oriented to exercise equipment and safety routine. understanding verbalized.   30 day ITP review.  pt demonstrates eagerness to participate in CR opportunities.       Comments:

## 2019-01-17 ENCOUNTER — Ambulatory Visit (HOSPITAL_COMMUNITY): Payer: Medicare Other

## 2019-01-17 ENCOUNTER — Encounter (HOSPITAL_COMMUNITY)
Admission: RE | Admit: 2019-01-17 | Discharge: 2019-01-17 | Disposition: A | Discharge status: Home / Self Care | Payer: Medicare Other | Source: Ambulatory Visit | Attending: Interventional Cardiology | Admitting: Interventional Cardiology

## 2019-01-17 DIAGNOSIS — Z955 Presence of coronary angioplasty implant and graft: Secondary | ICD-10-CM | POA: Diagnosis not present

## 2019-01-19 NOTE — Progress Notes (Deleted)
Cardiology Office Note:    Date:  01/19/2019   ID:  [name], DOB [birth year], MRN [id number]  PCP:  [name], MD  Cardiologist:  Sinclair Grooms, MD   Referring MD: [name], MD   No chief complaint on file.   History of Present Illness:    [name] is a 68 y.o. 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   Past Medical History:  Diagnosis Date  . Coronary artery disease CARDIOLOGIST- DR [name]  . Diabetes mellitus without complication ([place]) 3557   oral,insulin management  . History of acute inferior wall myocardial infarction    03-01-2012  STEMI  . History of kidney stones   . History of MI (myocardial infarction) 1998    . S/P drug eluting coronary stent placement   . Ureteral calculi left    Past Surgical History:  Procedure Laterality Date  . CORONARY ANGIOPLASTY WITH STENT PLACEMENT  1998  [facility])   [place]  . CORONARY ANGIOPLASTY WITH STENT PLACEMENT  03/03/2011   STENTING OF IN-STENT RESTENOSIS OF LEFT CIRDUMFLEX  (drug-eluting)  . CORONARY STENT INTERVENTION N/A 11/11/2018   Procedure: CORONARY STENT INTERVENTION;  Surgeon: Belva Crome, MD;  Location: [place] [facility];  Service: Cardiovascular;  Laterality: N/A;  . LEFT HEART CATH AND CORONARY ANGIOGRAPHY N/A 11/11/2018   Procedure: LEFT HEART CATH AND CORONARY ANGIOGRAPHY;  Surgeon: Belva Crome, MD;  Location: [place] [facility];  Service: Cardiovascular;  Laterality: N/A;    Current Medications: No outpatient medications have been marked as taking for the 01/20/19 encounter (Appointment) with Belva Crome, MD.     Allergies:   Patient has no  known allergies.   Social History   Socioeconomic History  . Marital status: Married    Spouse name: Not on file  . Number of children: Not on file  . Years of education: Not on file  . Highest education level: Not on file  Occupational History  . Not on file  Social Needs  . Financial resource strain: Not on file  . Food insecurity:    Worry: Not on file    Inability: Not on file  . Transportation needs:    Medical: Not on file    Non-medical: Not on file  Tobacco Use  . Smoking status: Former Smoker    Last attempt to quit: 02/12/1989    Years since quitting: 29.9  . Smokeless tobacco: Never Used  Substance and Sexual Activity  . Alcohol use: Yes    Alcohol/week: 0.0 standard drinks    Comment: rare  . Drug use: No  . Sexual activity: Not on file  Lifestyle  . Physical activity:    Days per week: Not on file    Minutes per session: Not on file  . Stress: Not on file  Relationships  . Social connections:    Talks on phone: Not on file    Gets together: Not on file    Attends religious service: Not on file    Active member of club or organization: Not on file    Attends meetings of clubs or organizations: Not on file    Relationship status: Not  on file  Other Topics Concern  . Not on file  Social History Narrative  . Not on file     Family History: The patient's family history includes COPD in his mother; Diabetes in his brother; Healthy in his father; Heart attack in his brother; Kidney disease in his mother.  ROS:   Please see the history of present illness.    *** All other systems reviewed and are negative.  EKGs/Labs/Other Studies Reviewed:    The following studies were reviewed today: ***  EKG:  EKG ***  Recent Labs: 11/12/2018: Hemoglobin 14.3; Platelets 177 11/25/2018: BUN 21; Creatinine, Ser 1.07; Potassium 4.9; Sodium 136  Recent Lipid Panel    Component Value Date/Time   CHOL 112 11/11/2018 0021   TRIG 266 (H) 11/11/2018 0021   HDL 29  (L) 11/11/2018 0021   CHOLHDL 3.9 11/11/2018 0021   VLDL 53 (H) 11/11/2018 0021   LDLCALC 30 11/11/2018 0021    Physical Exam:    VS:  There were no vitals taken for this visit.    Wt Readings from Last 3 Encounters:  01/09/19 177 lb 0.5 oz (80.3 kg)  11/25/18 180 lb (81.6 kg)  11/12/18 171 lb 15.3 oz (78 kg)     GEN: ***. No acute distress HEENT: Normal NECK: No JVD. LYMPHATICS: No lymphadenopathy CARDIAC: ***RRR.  *** murmur, ***gallop, ***edema VASCULAR: *** Pulses, *** bruits RESPIRATORY:  Clear to auscultation without rales, wheezing or rhonchi  ABDOMEN: Soft, non-tender, non-distended, No pulsatile mass, MUSCULOSKELETAL: No deformity  SKIN: Warm and dry NEUROLOGIC:  Alert and oriented x 3 PSYCHIATRIC:  Normal affect   ASSESSMENT:    1. Coronary artery disease involving native coronary artery of native heart without angina pectoris   2. Essential hypertension   3. Pure hypercholesterolemia   4. 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 ***   Medication Adjustments/Labs and Tests Ordered: Current medicines are reviewed at length with the patient today.  Concerns regarding medicines are outlined above.  No orders of the defined types were placed in this encounter.  No orders of the defined types were placed in this encounter.   There are no Patient Instructions on file for this visit.   Signed, Sinclair Grooms, MD  01/19/2019 6:39 PM    [place]

## 2019-01-20 ENCOUNTER — Ambulatory Visit (HOSPITAL_COMMUNITY): Payer: Medicare Other

## 2019-01-20 ENCOUNTER — Encounter (HOSPITAL_COMMUNITY): Payer: Medicare Other

## 2019-01-20 ENCOUNTER — Ambulatory Visit: Payer: Medicare Other | Admitting: Interventional Cardiology

## 2019-01-20 ENCOUNTER — Other Ambulatory Visit: Payer: Medicare Other

## 2019-01-20 ENCOUNTER — Telehealth: Payer: Self-pay

## 2019-01-20 NOTE — Telephone Encounter (Signed)
Patient meets inclusion criteria for current pharmacy residency project to initiate SGLT2i or GLP1-RA therapy for cardiovascular benefit due to current diagnosis of ASCVD and DM.  Patient did not present for scheduled appointment with Dr. [name] on 01/20/19.   Pharmacist will monitor for future appointments and will discuss initiation of Jardiance 10mg  or Victoza titrated to 1.8mg  and enrollment into study with patient and provider at rescheduled office visit.   Relevant labs and dates: Lab Results  Component Value Date   HGBA1C (H) 03/04/2011    8.8 (NOTE)                                                                       According to the ADA Clinical Practice Recommendations for 2011, when HbA1c is used as a screening test:   >=6.5%   Diagnostic of Diabetes Mellitus           (if abnormal result  is confirmed)  5.7-6.4%   Increased risk of developing Diabetes Mellitus  References:Diagnosis and Classification of Diabetes Mellitus,Diabetes XFGH,8299,37(JIRCV 1):S62-S69 and Standards of Medical Care in         Diabetes - 2011,Diabetes Care,2011,34  (Suppl 1):S11-S61.  HA1C 7.3% 06/05/18 per [facility]   Lab Results  Component Value Date   CREATININE 1.07 11/25/2018   CREATININE 1.06 11/12/2018   CREATININE 1.03 11/11/2018   CrCl cannot be calculated (Patient's most recent lab result is older than the maximum 21 days allowed.). Wt Readings from Last 1 Encounters:  01/09/19 177 lb 0.5 oz (80.3 kg)   BP Readings from Last 1 Encounters:  01/09/19 118/78    Metformin use: Tor Netters D PGY1 Pharmacy Resident  Phone [phone] Please use [facility] for clinical pharmacists numbers  01/20/2019      10:47 AM

## 2019-01-21 ENCOUNTER — Encounter: Payer: Self-pay | Admitting: Interventional Cardiology

## 2019-01-22 ENCOUNTER — Encounter (HOSPITAL_COMMUNITY)
Admission: RE | Admit: 2019-01-22 | Discharge: 2019-01-22 | Disposition: A | Discharge status: Home / Self Care | Payer: Medicare Other | Source: Ambulatory Visit | Attending: Interventional Cardiology | Admitting: Interventional Cardiology

## 2019-01-22 ENCOUNTER — Ambulatory Visit (HOSPITAL_COMMUNITY): Payer: Medicare Other

## 2019-01-22 DIAGNOSIS — Z955 Presence of coronary angioplasty implant and graft: Secondary | ICD-10-CM

## 2019-01-24 ENCOUNTER — Encounter (HOSPITAL_COMMUNITY)
Admission: RE | Admit: 2019-01-24 | Discharge: 2019-01-24 | Disposition: A | Discharge status: Home / Self Care | Payer: Medicare Other | Source: Ambulatory Visit | Attending: Interventional Cardiology | Admitting: Interventional Cardiology

## 2019-01-24 ENCOUNTER — Ambulatory Visit (HOSPITAL_COMMUNITY): Payer: Medicare Other

## 2019-01-24 DIAGNOSIS — Z955 Presence of coronary angioplasty implant and graft: Secondary | ICD-10-CM | POA: Diagnosis not present

## 2019-01-27 ENCOUNTER — Ambulatory Visit (HOSPITAL_COMMUNITY): Payer: Medicare Other

## 2019-01-27 ENCOUNTER — Encounter (HOSPITAL_COMMUNITY)
Admission: RE | Admit: 2019-01-27 | Discharge: 2019-01-27 | Disposition: A | Discharge status: Home / Self Care | Payer: Medicare Other | Source: Ambulatory Visit | Attending: Interventional Cardiology | Admitting: Interventional Cardiology

## 2019-01-27 DIAGNOSIS — Z955 Presence of coronary angioplasty implant and graft: Secondary | ICD-10-CM | POA: Diagnosis not present

## 2019-01-27 NOTE — Progress Notes (Signed)
I have reviewed a Home Exercise Prescription with Lilla Shook . Vandell is currently exercising at home.  The patient was advised to walk 5-7 days a week for 30-45 minutes.  Amiere and I discussed how to progress their exercise prescription.  The patient stated that their goals were to increase the days of the week that he walks for exercise as well as increase the amount of time he walks.  The patient stated that they understand the exercise prescription.  We reviewed exercise guidelines, target heart rate during exercise, RPE Scale, weather conditions, NTG use, endpoints for exercise, warmup and cool down.  Patient is encouraged to come to me with any questions. I will continue to follow up with the patient to assist them with progression and safety.    01/27/2019 9:51 AM  Deitra Mayo BS, ACSM CEP

## 2019-01-29 ENCOUNTER — Ambulatory Visit (HOSPITAL_COMMUNITY): Payer: Medicare Other

## 2019-01-29 ENCOUNTER — Encounter (HOSPITAL_COMMUNITY)
Admission: RE | Admit: 2019-01-29 | Discharge: 2019-01-29 | Disposition: A | Discharge status: Home / Self Care | Payer: Medicare Other | Source: Ambulatory Visit | Attending: Interventional Cardiology | Admitting: Interventional Cardiology

## 2019-01-29 DIAGNOSIS — Z955 Presence of coronary angioplasty implant and graft: Secondary | ICD-10-CM | POA: Diagnosis not present

## 2019-01-31 ENCOUNTER — Encounter (HOSPITAL_COMMUNITY): Payer: Medicare Other

## 2019-01-31 ENCOUNTER — Ambulatory Visit (HOSPITAL_COMMUNITY): Payer: Medicare Other

## 2019-02-03 ENCOUNTER — Encounter (HOSPITAL_COMMUNITY)
Admission: RE | Admit: 2019-02-03 | Discharge: 2019-02-03 | Disposition: A | Discharge status: Home / Self Care | Payer: Medicare Other | Source: Ambulatory Visit | Attending: Interventional Cardiology | Admitting: Interventional Cardiology

## 2019-02-03 ENCOUNTER — Ambulatory Visit (HOSPITAL_COMMUNITY): Payer: Medicare Other

## 2019-02-03 DIAGNOSIS — Z955 Presence of coronary angioplasty implant and graft: Secondary | ICD-10-CM

## 2019-02-04 NOTE — Progress Notes (Signed)
I have reviewed a Home Exercise Prescription with Lilla Shook . Delano is not currently exercising at home.  The patient was advised to walk 5-7 days a week for 30-45 minutes.  Galan and I discussed how to progress their exercise prescription.  The patient stated that their goals were to make time to exercise at home in addition to Cardiac Rehab.  The patient stated that they understand the exercise prescription.  We reviewed exercise guidelines, target heart rate during exercise, RPE Scale, weather conditions, NTG use, endpoints for exercise, warmup and cool down.  Patient is encouraged to come to me with any questions. I will continue to follow up with the patient to assist them with progression and safety.    02/04/2019 1:56 PM  Deitra Mayo BS, ACSM CEP

## 2019-02-05 ENCOUNTER — Ambulatory Visit (HOSPITAL_COMMUNITY): Payer: Medicare Other

## 2019-02-05 ENCOUNTER — Encounter (HOSPITAL_COMMUNITY): Payer: Medicare Other

## 2019-02-07 ENCOUNTER — Ambulatory Visit (HOSPITAL_COMMUNITY): Payer: Medicare Other

## 2019-02-07 ENCOUNTER — Encounter (HOSPITAL_COMMUNITY): Payer: Medicare Other

## 2019-02-10 ENCOUNTER — Encounter (HOSPITAL_COMMUNITY): Payer: Medicare Other

## 2019-02-10 ENCOUNTER — Ambulatory Visit (HOSPITAL_COMMUNITY): Payer: Medicare Other

## 2019-02-12 ENCOUNTER — Encounter (HOSPITAL_COMMUNITY): Payer: Medicare Other

## 2019-02-12 ENCOUNTER — Ambulatory Visit (HOSPITAL_COMMUNITY): Payer: Medicare Other

## 2019-02-12 ENCOUNTER — Telehealth (HOSPITAL_COMMUNITY): Payer: Self-pay

## 2019-02-12 NOTE — Telephone Encounter (Signed)
Pt phone call to inquire about absence from Cardiac Rehab since 01/27/19. Pt has been on business trip for two weeks. Pt stated he would return to Cardiac Rehab 02/17/19.

## 2019-02-12 NOTE — Progress Notes (Signed)
Cardiac Individual Treatment Plan  Patient Details  Name: [name] MRN: [id number] Date of Birth: October 20, 1951 Referring Provider:   Flowsheet Row CARDIAC REHAB PHASE II ORIENTATION from 01/09/2019 in [place]  Referring Provider  Dr. [name]      Initial Encounter Date:  Flowsheet Row CARDIAC REHAB PHASE II ORIENTATION from 01/09/2019 in [place]  Date  01/09/19      Visit Diagnosis: Status post coronary artery stent placement  Patient's Home Medications on Admission:  Current Outpatient Medications:  .  aspirin EC 81 MG tablet, Take 1 tablet (81 mg total) by mouth daily., Disp: 90 tablet, Rfl: 3 .  atorvastatin (LIPITOR) 80 MG tablet, Take 1 tablet (80 mg total) by mouth daily., Disp: 90 tablet, Rfl: 4 .  calcium carbonate (TUMS EX) 750 MG chewable tablet, Chew 2 tablets by mouth as needed for heartburn., Disp: , Rfl:  .  clopidogrel (PLAVIX) 75 MG tablet, Take 1 tablet (75 mg total) by mouth daily with breakfast., Disp: 90 tablet, Rfl: 4 .  fish oil-omega-3 fatty acids 1000 MG capsule, Take 4 g by mouth daily. , Disp: , Rfl:  .  LEVEMIR FLEXTOUCH 100 UNIT/ML Pen, Inject 18-28 Units into the skin 2 (two) times daily. Taking 18 units in the AM and 28 units at night., Disp: , Rfl:  .  losartan (COZAAR) 50 MG tablet, Take 1 tablet (50 mg total) by mouth daily., Disp: 90 tablet, Rfl: 4 .  metFORMIN (GLUCOPHAGE) 500 MG tablet, Take 1,000 mg by mouth 2 (two) times daily with a meal. , Disp: , Rfl:  .  metoprolol tartrate (LOPRESSOR) 50 MG tablet, TAKE ONE TABLET BY MOUTH TWICE DAILY , Disp: 180 tablet, Rfl: 3 .  Multiple Vitamin (MULTIVITAMIN) tablet, Take 1 tablet by mouth daily., Disp: , Rfl:  .  nitroGLYCERIN (NITROSTAT) 0.4 MG SL tablet, Place 1 tablet (0.4 mg total) under the tongue every 5 (five) minutes x 3 doses as needed for chest pain., Disp: 25 tablet, Rfl: 0 .  Turmeric 500 MG CAPS, Take 500 mg by mouth as  needed (joint pain)., Disp: , Rfl:   Past Medical History: Past Medical History:  Diagnosis Date  . Coronary artery disease CARDIOLOGIST- DR [name]  . Diabetes mellitus without complication ([place]) 0300   oral,insulin management  . History of acute inferior wall myocardial infarction    03-01-2012  STEMI  . History of kidney stones   . History of MI (myocardial infarction) 1998    . S/P drug eluting coronary stent placement   . Ureteral calculi left    Tobacco Use: Social History   Tobacco Use  Smoking Status Former Smoker  . Last attempt to quit: 02/12/1989  . Years since quitting: 30.0  Smokeless Tobacco Never Used    Labs: Recent Review Flowsheet Data    Labs for ITP Cardiac and Pulmonary Rehab Latest Ref Rng & Units 03/02/2011 03/04/2011 03/09/2014 08/26/2015 11/11/2018   Cholestrol 0 - 200 mg/dL - - 160 131 112   LDLCALC 0 - 99 mg/dL - - 98 73 30   HDL >40 mg/dL - - 38.50(L) 41.40 29(L)   Trlycerides <150 mg/dL - - 116.0 82.0 266(H)   Hemoglobin A1c <5.7 % 8.4 (NOTE)  According to the ADA Clinical Practice Recommendations for 2011, when HbA1c is used as a screening test:   >=6.5%   Diagnostic of Diabetes Mellitus           (if abnormal result is confirmed)  5.7-6.4%   Increased risk of developing Diabetes Mellitus  References:Diagnosis and Classification of Diabetes Mellitus,Diabetes [id number],2119,41(DEYCX 1):S62-S69 and Standards of Medical Care in         Diabetes - 2011,Diabetes [id number],8563,14 (Suppl 1):S11-S61.(H) 8.8 (NOTE)                                                                       According to the ADA Clinical Practice Recommendations for 2011, when HbA1c is used as a screening test:   >=6.5%   Diagnostic of Diabetes Mellitus           (if abnormal result is confirmed)  5.7-6.4%   Increased risk of developing Diabetes Mellitus  References:Diagnosis and Classification of Diabetes Mellitus,Diabetes  [id number],6378,58(IFOYD 1):S62-S69 and Standards of Medical Care in         Diabetes - 2011,Diabetes XAJO,8786,76 (Suppl 1):S11-S61.(H) - - -   TCO2 0 - 100 mmol/L 22 - - - -      Capillary Blood Glucose: Lab Results  Component Value Date   GLUCAP 149 (H) 01/15/2019   GLUCAP 158 (H) 01/13/2019   GLUCAP 192 (H) 01/13/2019   GLUCAP 126 (H) 11/12/2018   GLUCAP 115 (H) 11/12/2018     Exercise Target Goals: Exercise Program Goal: Individual exercise prescription set using results from initial 6 min walk test and THRR while considering  patient's activity barriers and safety.   Exercise Prescription Goal: Initial exercise prescription builds to 30-45 minutes a day of aerobic activity, 2-3 days per week.  Home exercise guidelines will be given to patient during program as part of exercise prescription that the participant will acknowledge.  Activity Barriers & Risk Stratification: Activity Barriers & Cardiac Risk Stratification - 01/09/19 1100    Activity Barriers & Cardiac Risk Stratification          Activity Barriers  Joint Problems    Cardiac Risk Stratification  Moderate           6 Minute Walk: 6 Minute Walk    6 Minute Walk    Row Name 01/09/19 1100   Phase  Initial   Distance  1671 feet   Walk Time  6 minutes   # of Rest Breaks  0   MPH  3.2   METS  4.56   RPE  11   VO2 Peak  15.97   Symptoms  Yes (comment)   Comments  Hip pain.    Resting HR  90 bpm   Resting BP  118/78   Resting Oxygen Saturation   98 %   Exercise Oxygen Saturation  during 6 min walk  99 %   Max Ex. HR  107 bpm   Max Ex. BP  152/80   2 Minute Post BP  122/70          Oxygen Initial Assessment:   Oxygen Re-Evaluation:   Oxygen Discharge (Final Oxygen Re-Evaluation):   Initial Exercise Prescription: Initial Exercise Prescription - 01/09/19 1100    Date of Initial Exercise  RX and Referring Provider          Date  01/09/19    Referring Provider  Dr. [name]    Expected Discharge  Date  04/18/19        Bike          Level  1    Minutes  10    METs  3.36        NuStep          Level  3    SPM  85    Minutes  10    METs  4.4        Track          Laps  12    Minutes  10    METs  3.07        Prescription Details          Frequency (times per week)  3    Duration  Progress to 30 minutes of continuous aerobic without signs/symptoms of physical distress        Intensity          THRR 40-80% of Max Heartrate  62-124    Ratings of Perceived Exertion  11-13        Progression          Progression  Continue to progress workloads to maintain intensity without signs/symptoms of physical distress.        Resistance Training          Training Prescription  Yes    Weight  4 lbs.     Reps  10-15           Perform Capillary Blood Glucose checks as needed.  Exercise Prescription Changes: Exercise Prescription Changes    Response to Exercise    Row Name 01/13/19 1000 02/04/19 1300   Blood Pressure (Admit)  136/84  120/67   Blood Pressure (Exercise)  130/74  134/84   Blood Pressure (Exit)  124/72  118/72   Heart Rate (Admit)  80 bpm  69 bpm   Heart Rate (Exercise)  112 bpm  101 bpm   Heart Rate (Exit)  86 bpm  64 bpm   Rating of Perceived Exertion (Exercise)  12  15   Symptoms  no documentation  None   Comments  Pt first day of exercise.   no documentation   Duration  Progress to 30 minutes of  aerobic without signs/symptoms of physical distress  Progress to 30 minutes of  aerobic without signs/symptoms of physical distress   Intensity  THRR unchanged  THRR unchanged       Progression    Row Name 01/13/19 1000 02/04/19 1300   Progression  Continue to progress workloads to maintain intensity without signs/symptoms of physical distress.  Continue to progress workloads to maintain intensity without signs/symptoms of physical distress.   Average METs  3.2  3.6       Resistance Training    Row Name 01/13/19 1000 02/04/19 1300   Training  Prescription  Yes  No   Weight  4 lbs.   no documentation   Reps  10-15  no documentation   Time  10 Minutes  no documentation       [place] Name 01/13/19 1000 02/04/19 1300   Level  1  1   Minutes  10  10   METs  3.36  3.37       NuStep    Row Name  01/13/19 1000 02/04/19 1300   Level  3  3   SPM  85  85   Minutes  10  10   METs  3  3.8       Track    Row Name 01/13/19 1000 02/04/19 1300   Laps  12  16   Minutes  10  10   METs  3.07  3.78       Home Exercise Plan    Row Name 01/13/19 1000 02/04/19 1300   Plans to continue exercise at  no documentation  Home (comment)   Frequency  no documentation  Add 3 additional days to program exercise sessions.   Initial Home Exercises Provided  no documentation  01/29/19          Exercise Comments: Exercise Comments    Row Name 01/13/19 1013 01/27/19 0953 02/12/19 0813   Exercise Comments  Pt first day of exercise. Tolerated exercise well.   Reviewed HEP with Pt. Pt was responsive and understands goals.   Pt has been absent from Cardiac Rehab. Will follow up with Pt.       Exercise Goals and Review: Exercise Goals    Exercise Goals    Row Name 01/09/19 1101   Increase Physical Activity  Yes   Intervention  Provide advice, education, support and counseling about physical activity/exercise needs.;Develop an individualized exercise prescription for aerobic and resistive training based on initial evaluation findings, risk stratification, comorbidities and participant's personal goals.   Expected Outcomes  Short Term: Attend rehab on a regular basis to increase amount of physical activity.;Long Term: Add in home exercise to make exercise part of routine and to increase amount of physical activity.;Long Term: Exercising regularly at least 3-5 days a week.   Increase Strength and Stamina  Yes   Intervention  Provide advice, education, support and counseling about physical activity/exercise needs.;Develop an individualized  exercise prescription for aerobic and resistive training based on initial evaluation findings, risk stratification, comorbidities and participant's personal goals.   Expected Outcomes  Short Term: Increase workloads from initial exercise prescription for resistance, speed, and METs.   Able to understand and use rate of perceived exertion (RPE) scale  Yes   Intervention  Provide education and explanation on how to use RPE scale   Expected Outcomes  Short Term: Able to use RPE daily in rehab to express subjective intensity level;Long Term:  Able to use RPE to guide intensity level when exercising independently   Knowledge and understanding of Target Heart Rate Range (THRR)  Yes   Intervention  Provide education and explanation of THRR including how the numbers were predicted and where they are located for reference   Expected Outcomes  Short Term: Able to state/look up THRR;Long Term: Able to use THRR to govern intensity when exercising independently;Short Term: Able to use daily as guideline for intensity in rehab   Able to check pulse independently  Yes   Intervention  Provide education and demonstration on how to check pulse in carotid and radial arteries.;Review the importance of being able to check your own pulse for safety during independent exercise   Expected Outcomes  Short Term: Able to explain why pulse checking is important during independent exercise;Long Term: Able to check pulse independently and accurately   Understanding of Exercise Prescription  Yes   Intervention  Provide education, explanation, and written materials on patient's individual exercise prescription   Expected Outcomes  Short Term: Able to explain program exercise prescription;Long Term: Able  to explain home exercise prescription to exercise independently          Exercise Goals Re-Evaluation : Exercise Goals Re-Evaluation    Exercise Goal Re-Evaluation    Row Name 01/13/19 1012 01/27/19 0951 02/12/19 0812    Exercise Goals Review  Increase Physical Activity;Increase Strength and Stamina;Able to understand and use rate of perceived exertion (RPE) scale;Knowledge and understanding of Target Heart Rate Range (THRR);Understanding of Exercise Prescription  Increase Physical Activity;Increase Strength and Stamina;Able to understand and use rate of perceived exertion (RPE) scale;Knowledge and understanding of Target Heart Rate Range (THRR);Understanding of Exercise Prescription  Increase Physical Activity;Increase Strength and Stamina;Able to understand and use rate of perceived exertion (RPE) scale;Knowledge and understanding of Target Heart Rate Range (THRR);Understanding of Exercise Prescription   Comments  Pt first day of exercise. Pt tolerated exercise well and understands RPE scale, THRR, and exercise Rx.   Reviewed HEP with Pt. Pt was responsive and understands goals, THRR, NTG use, weather precautions, and end points of exercise. Encouraged Pt to walk 2-4 days a week for 30-45 minutes in addition to Cardiac Rehab.   Pt has been absent from Cardiac Rehab. Will follow up with Pt about attendance in the program.    Expected Outcomes  Will continue to monitor and progress Pt as tolerated.   Will continue to monitor and progress Pt as tolerated.   Will follow up wiht Pt.           Discharge Exercise Prescription (Final Exercise Prescription Changes): Exercise Prescription Changes - 02/04/19 1300    Response to Exercise          Blood Pressure (Admit)  120/67    Blood Pressure (Exercise)  134/84    Blood Pressure (Exit)  118/72    Heart Rate (Admit)  69 bpm    Heart Rate (Exercise)  101 bpm    Heart Rate (Exit)  64 bpm    Rating of Perceived Exertion (Exercise)  15    Symptoms  None    Duration  Progress to 30 minutes of  aerobic without signs/symptoms of physical distress    Intensity  THRR unchanged        Progression          Progression  Continue to progress workloads to maintain intensity  without signs/symptoms of physical distress.    Average METs  3.6        Resistance Training          Training Prescription  No        Bike          Level  1    Minutes  10    METs  3.37        NuStep          Level  3    SPM  85    Minutes  10    METs  3.8        Track          Laps  16    Minutes  10    METs  3.78        Home Exercise Plan          Plans to continue exercise at  Home (comment)    Frequency  Add 3 additional days to program exercise sessions.    Initial Home Exercises Provided  01/29/19           Nutrition:  Target Goals: Understanding of nutrition guidelines, daily intake  of sodium 1500mg , cholesterol 200mg , calories 30% from fat and 7% or less from saturated fats, daily to have 5 or more servings of fruits and vegetables.  Biometrics: Pre Biometrics - 01/09/19 1101    Pre Biometrics          Height  6\' 3"  (1.905 m)    Weight  80.3 kg    Waist Circumference  35 inches    Hip Circumference  39 inches    Waist to Hip Ratio  0.9 %    BMI (Calculated)  22.13    Triceps Skinfold  25 mm    % Body Fat  24.7 %    Grip Strength  36 kg    Flexibility  0 in    Single Leg Stand  3.12 seconds            Nutrition Therapy Plan and Nutrition Goals: Nutrition Therapy & Goals - 01/09/19 1045    Nutrition Therapy          Diet  heart healthy, carb modified        Personal Nutrition Goals          Nutrition Goal  Pt to identify and limit food sources of saturated fat, trans fat, refined carbohydrates and sodium    Personal Goal #2  Pt to build a healthy plate including appropriate portion sizes of vegetables, fruits, whole grains, and lean protein in a heart healthy meal plan.    Personal Goal #3  Pt able to name foods that affect blood glucose    Personal Goal #4  Pt to identify food quantities necessary to achieve weight loss of 6-24 lb at graduation from cardiac rehab.         Intervention Plan          Intervention  Prescribe,  educate and counsel regarding individualized specific dietary modifications aiming towards targeted core components such as weight, hypertension, lipid management, diabetes, heart failure and other comorbidities.    Expected Outcomes  Short Term Goal: Understand basic principles of dietary content, such as calories, fat, sodium, cholesterol and nutrients.;Long Term Goal: Adherence to prescribed nutrition plan.           Nutrition Assessments: Nutrition Assessments - 01/09/19 1008    MEDFICTS Scores          Pre Score  26           Nutrition Goals Re-Evaluation: Nutrition Goals Re-Evaluation    Goals    Row Name 01/09/19 1045   Current Weight  177 lb 0.5 oz (80.3 kg)          Nutrition Goals Re-Evaluation: Nutrition Goals Re-Evaluation    Goals    Row Name 01/09/19 1045   Current Weight  177 lb 0.5 oz (80.3 kg)          Nutrition Goals Discharge (Final Nutrition Goals Re-Evaluation): Nutrition Goals Re-Evaluation - 01/09/19 1045    Goals          Current Weight  177 lb 0.5 oz (80.3 kg)           Psychosocial: Target Goals: Acknowledge presence or absence of significant depression and/or stress, maximize coping skills, provide positive support system. Participant is able to verbalize types and ability to use techniques and skills needed for reducing stress and depression.  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         Family Dynamics          Good Support System?  Yes   Pt lists his family and church as sources of support.    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        Barriers          Psychosocial barriers to participate in program  The patient should benefit from training in stress  management and relaxation.        Screening Interventions          Interventions  Encouraged to exercise    Expected Outcomes  Short Term goal: Utilizing psychosocial counselor, staff and physician to assist with identification of specific Stressors or current issues interfering with healing process. Setting desired goal for each stressor or current issue identified.;Long Term Goal: Stressors or current issues are controlled or eliminated.;Short Term goal: Identification and review with participant of any Quality of Life or Depression concerns found by scoring the questionnaire.;Long Term goal: The participant improves quality of Life and PHQ9 Scores as seen by post scores and/or verbalization of changes           Quality of Life Scores: Quality of Life - 01/27/19 1059    Quality of Life Scores          Health/Function Pre  20 %    Socioeconomic Pre  21.71 %    Psych/Spiritual Pre  12.93 %    Family Pre  15.1 %    GLOBAL Pre  18.18 %   overall pt displays jaded outlook with effective coping skills. pt divorced parent of special needs total care adult son.          Scores of 19 and below usually indicate a poorer quality of life in these areas.  A difference of  2-3 points is a clinically meaningful difference.  A difference of 2-3 points in the total score of the Quality of Life Index has been associated with significant improvement in overall quality of life, self-image, physical symptoms, and general health in studies assessing change in quality of life.  PHQ-9: Recent Review Flowsheet Data    Depression screen [facility] 2/9 01/13/2019   Decreased Interest 0   Down, Depressed, Hopeless 0   PHQ - 2 Score 0     Interpretation of Total Score  Total Score Depression Severity:  1-4 = Minimal depression, 5-9 = Mild depression, 10-14 = Moderate depression, 15-19 = Moderately severe depression, 20-27 = Severe depression   Psychosocial Evaluation and Intervention: Psychosocial Evaluation -  01/13/19 1003    Psychosocial Evaluation & Interventions          Interventions  Stress management education;Relaxation education;Encouraged to exercise with the program and follow exercise prescription    Comments  pt with remote history of reactive depression denies current depressive symptoms. PHQ-0 today. pt demonstrates good coping skills.  pt is widower.  pt enjoys bridge tournaments and participated in one this weekend.   will continue to screen for depression throughout program.     Expected Outcomes  pt will exhibit positive outlook with good coping skills.     Continue Psychosocial Services   Follow up required by staff           Psychosocial  Re-Evaluation: Psychosocial Re-Evaluation    Psychosocial Re-Evaluation    Row Name 01/16/19 1015 02/12/19 1732   Current issues with  History of Depression  History of Depression   Comments  no psychosocial needs identified, no interventions necessary. pt with h/o depression encouraged to participate in stress and relaxation education.   no psychosocial needs identified, no interventions necessary. pt with h/o depression encouraged to participate in stress and relaxation education.    Expected Outcomes  pt will exhibit positive outlook with good coping skills.   pt will exhibit positive outlook with good coping skills.    Interventions  Encouraged to attend Cardiac Rehabilitation for the exercise;Stress management education;Relaxation education  Encouraged to attend Cardiac Rehabilitation for the exercise;Stress management education;Relaxation education   Continue Psychosocial Services   Follow up required by staff  Follow up required by staff          Psychosocial Discharge (Final Psychosocial Re-Evaluation): Psychosocial Re-Evaluation - 02/12/19 1732    Psychosocial Re-Evaluation          Current issues with  History of Depression    Comments  no psychosocial needs identified, no interventions necessary. pt with h/o depression  encouraged to participate in stress and relaxation education.     Expected Outcomes  pt will exhibit positive outlook with good coping skills.     Interventions  Encouraged to attend Cardiac Rehabilitation for the exercise;Stress management education;Relaxation education    Continue Psychosocial Services   Follow up required by staff           Vocational Rehabilitation: Provide vocational rehab assistance to qualifying candidates.   Vocational Rehab Evaluation & Intervention: Vocational Rehab - 01/09/19 1217    Initial Vocational Rehab Evaluation & Intervention          Assessment shows need for Vocational Rehabilitation  No           Education: Education Goals: Education classes will be provided on a weekly basis, covering required topics. Participant will state understanding/return demonstration of topics presented.  Learning Barriers/Preferences: Learning Barriers/Preferences - 01/09/19 1058    Learning Barriers/Preferences          Learning Barriers  Sight    Learning Preferences  Written Material           Education Topics: Count Your Pulse:  -Group instruction provided by verbal instruction, demonstration, patient participation and written materials to support subject.  Instructors address importance of being able to find your pulse and how to count your pulse when at home without a heart monitor.  Patients get hands on experience counting their pulse with staff help and individually.   Heart Attack, Angina, and Risk Factor Modification:  -Group instruction provided by verbal instruction, video, and written materials to support subject.  Instructors address signs and symptoms of angina and heart attacks.    Also discuss risk factors for heart disease and how to make changes to improve heart health risk factors.   Functional Fitness:  -Group instruction provided by verbal instruction, demonstration, patient participation, and written materials to support subject.   Instructors address safety measures for doing things around the house.  Discuss how to get up and down off the floor, how to pick things up properly, how to safely get out of a chair without assistance, and balance training.   Meditation and Mindfulness:  -Group instruction provided by verbal instruction, patient participation, and written materials to support subject.  Instructor addresses importance of mindfulness and meditation practice to help reduce stress  and improve awareness.  Instructor also leads participants through a meditation exercise.    Stretching for Flexibility and Mobility:  -Group instruction provided by verbal instruction, patient participation, and written materials to support subject.  Instructors lead participants through series of stretches that are designed to increase flexibility thus improving mobility.  These stretches are additional exercise for major muscle groups that are typically performed during regular warm up and cool down.   Hands Only CPR:  -Group verbal, video, and participation provides a basic overview of AHA guidelines for community CPR. Role-play of emergencies allow participants the opportunity to practice calling for help and chest compression technique with discussion of AED use.   Hypertension: -Group verbal and written instruction that provides a basic overview of hypertension including the most recent diagnostic guidelines, risk factor reduction with self-care instructions and medication management. Flowsheet Row CARDIAC REHAB PHASE II EXERCISE from 01/22/2019 in [facility]  Date  01/17/19  Educator  RN  Instruction Review Code  2- Demonstrated Understanding       Nutrition I class: Heart Healthy Eating:  -Group instruction provided by PowerPoint slides, verbal discussion, and written materials to support subject matter. The instructor gives an explanation and review of the Therapeutic Lifestyle Changes diet  recommendations, which includes a discussion on lipid goals, dietary fat, sodium, fiber, plant stanol/sterol esters, sugar, and the components of a well-balanced, healthy diet.   Nutrition II class: Lifestyle Skills:  -Group instruction provided by PowerPoint slides, verbal discussion, and written materials to support subject matter. The instructor gives an explanation and review of label reading, grocery shopping for heart health, heart healthy recipe modifications, and ways to make healthier choices when eating out.   Diabetes Question & Answer:  -Group instruction provided by PowerPoint slides, verbal discussion, and written materials to support subject matter. The instructor gives an explanation and review of diabetes co-morbidities, pre- and post-prandial blood glucose goals, pre-exercise blood glucose goals, signs, symptoms, and treatment of hypoglycemia and hyperglycemia, and foot care basics.   Diabetes Blitz:  -Group instruction provided by PowerPoint slides, verbal discussion, and written materials to support subject matter. The instructor gives an explanation and review of the physiology behind type 1 and type 2 diabetes, diabetes medications and rational behind using different medications, pre- and post-prandial blood glucose recommendations and Hemoglobin A1c goals, diabetes diet, and exercise including blood glucose guidelines for exercising safely.    Portion Distortion:  -Group instruction provided by PowerPoint slides, verbal discussion, written materials, and food models to support subject matter. The instructor gives an explanation of serving size versus portion size, changes in portions sizes over the last 20 years, and what consists of a serving from each food group.   Stress Management:  -Group instruction provided by verbal instruction, video, and written materials to support subject matter.  Instructors review role of stress in heart disease and how to cope with stress  positively.     Exercising on Your Own:  -Group instruction provided by verbal instruction, power point, and written materials to support subject.  Instructors discuss benefits of exercise, components of exercise, frequency and intensity of exercise, and end points for exercise.  Also discuss use of nitroglycerin and activating EMS.  Review options of places to exercise outside of rehab.  Review guidelines for sex with heart disease.   Cardiac Drugs I:  -Group instruction provided by verbal instruction and written materials to support subject.  Instructor reviews cardiac drug classes: antiplatelets, anticoagulants, beta blockers,  and statins.  Instructor discusses reasons, side effects, and lifestyle considerations for each drug class.   Cardiac Drugs II:  -Group instruction provided by verbal instruction and written materials to support subject.  Instructor reviews cardiac drug classes: angiotensin converting enzyme inhibitors (ACE-I), angiotensin II receptor blockers (ARBs), nitrates, and calcium channel blockers.  Instructor discusses reasons, side effects, and lifestyle considerations for each drug class. Flowsheet Row CARDI[facility] RE[facility]AB PHASE [facility] EXERC[facility]  Date  01/22/19  Instruction Review Code  2- Demonstrated Understanding      Anatomy and Physiology of the Circulatory System:  Group verbal and written instruction and models provide basic cardiac anatomy and physiology, with the coronary electrical and arterial systems. Review of: AMI, Angina, Valve disease, Heart Failure, Peripheral Artery Disease, Cardiac Arrhythmia, Pacemakers, and the ICD.   Other Education:  -Group or individual verbal, written, or video instructions that support the educational goals of the cardiac rehab program.   Holiday Eating Survival Tips:  -Group instruction provided by PowerPoint slides, verbal discussion, and written materials to support subject  matter. The instructor gives patients tips, tricks, and techniques to help them not only survive but enjoy the holidays despite the onslaught of food that accompanies the holidays.   Knowledge Questionnaire Score: Knowledge Questionnaire Score - 01/09/19 1058    Knowledge Questionnaire Score          Pre Score  18/24           Core Components/Risk Factors/Patient Goals at Admission: Personal Goals and Risk Factors at Admission - 01/09/19 1059    Core Components/Risk Factors/Patient Goals on Admission           Weight Management  Yes;Weight Maintenance    Intervention  Weight Management: Develop a combined nutrition and exercise program designed to reach desired caloric intake, while maintaining appropriate intake of nutrient and fiber, sodium and fats, and appropriate energy expenditure required for the weight goal.;Weight Management: Provide education and appropriate resources to help participant work on and attain dietary goals.    Admit Weight  177 lb 0.5 oz (80.3 kg)    Expected Outcomes  Short Term: Continue to assess and modify interventions until short term weight is achieved;Long Term: Adherence to nutrition and physical activity/exercise program aimed toward attainment of established weight goal;Weight Maintenance: Understanding of the daily nutrition guidelines, which includes 25-35% calories from fat, 7% or less cal from saturated fats, less than 200mg  cholesterol, less than 1.5gm of sodium, & 5 or more servings of fruits and vegetables daily;Understanding recommendations for meals to include 15-35% energy as protein, 25-35% energy from fat, 35-60% energy from carbohydrates, less than 200mg  of dietary cholesterol, 20-35 gm of total fiber daily;Understanding of distribution of calorie intake throughout the day with the consumption of 4-5 meals/snacks    Diabetes  Yes    Intervention  Provide education about signs/symptoms and action to take for hypo/hyperglycemia.;Provide education  about proper nutrition, including hydration, and aerobic/resistive exercise prescription along with prescribed medications to achieve blood glucose in normal ranges: Fasting glucose 65-99 mg/dL    Expected Outcomes  Short Term: Participant verbalizes understanding of the signs/symptoms and immediate care of hyper/hypoglycemia, proper foot care and importance of medication, aerobic/resistive exercise and nutrition plan for blood glucose control.;Long Term: Attainment of HbA1C < 7%.    Hypertension  Yes    Intervention  Provide education on lifestyle modifcations including regular physical activity/exercise, weight management, moderate sodium restriction and increased consumption of fresh  fruit, vegetables, and low fat dairy, alcohol moderation, and smoking cessation.;Monitor prescription use compliance.    Expected Outcomes  Short Term: Continued assessment and intervention until BP is < 140/7mm HG in hypertensive participants. < 130/29mm HG in hypertensive participants with diabetes, heart failure or chronic kidney disease.;Long Term: Maintenance of blood pressure at goal levels.    Lipids  Yes    Intervention  Provide education and support for participant on nutrition & aerobic/resistive exercise along with prescribed medications to achieve LDL 70mg , HDL >40mg .    Expected Outcomes  Short Term: Participant states understanding of desired cholesterol values and is compliant with medications prescribed. Participant is following exercise prescription and nutrition guidelines.;Long Term: Cholesterol controlled with medications as prescribed, with individualized exercise RX and with personalized nutrition plan. Value goals: LDL < 70mg , HDL > 40 mg.    Stress  Yes    Intervention  Offer individual and/or small group education and counseling on adjustment to heart disease, stress management and health-related lifestyle change. Teach and support self-help strategies.;Refer participants experiencing significant  psychosocial distress to appropriate mental health specialists for further evaluation and treatment. When possible, include family members and significant others in education/counseling sessions.    Expected Outcomes  Short Term: Participant demonstrates changes in health-related behavior, relaxation and other stress management skills, ability to obtain effective social support, and compliance with psychotropic medications if prescribed.;Long Term: Emotional wellbeing is indicated by absence of clinically significant psychosocial distress or social isolation.           Core Components/Risk Factors/Patient Goals Review:  Goals and Risk Factor Review    Core Components/Risk Factors/Patient Goals Review    Row Name 01/13/19 1007 01/16/19 1017 02/12/19 1733   Personal Goals Review  Weight Management/Obesity;Diabetes;Hypertension;Lipids;Stress  Weight Management/Obesity;Diabetes;Hypertension;Lipids;Stress  Weight Management/Obesity;Diabetes;Hypertension;Lipids;Stress   Review  pt with multiple CAD RF demonstrates tolerance to CR participation. pt demonstrates diabetes self care knowledge however needs nutritional reinforcement and guidance. pt persaonal goals are to maintain healthy weight and   pt with multiple CAD RF demonstrates tolerance to CR participation. pt demonstrates diabetes self care knowledge however needs nutritional reinforcement and guidance. pt persaonal goals are to maintain healthy weight and   pt with multiple CAD RF demonstrates tolerance to CR participation. pt demonstrates diabetes self care knowledge however needs nutritional reinforcement and guidance. pt persaonal goals are to maintain healthy weight and    Expected Outcomes  pt will participate in CR exercise, nutrition and lifestyle modification to decrease overall RF.   pt will participate in CR exercise, nutrition and lifestyle modification to decrease overall RF.   pt will participate in CR exercise, nutrition and lifestyle  modification to decrease overall RF.           Core Components/Risk Factors/Patient Goals at Discharge (Final Review):  Goals and Risk Factor Review - 02/12/19 1733    Core Components/Risk Factors/Patient Goals Review          Personal Goals Review  Weight Management/Obesity;Diabetes;Hypertension;Lipids;Stress    Review  pt with multiple CAD RF demonstrates tolerance to CR participation. pt demonstrates diabetes self care knowledge however needs nutritional reinforcement and guidance. pt persaonal goals are to maintain healthy weight and     Expected Outcomes  pt will participate in CR exercise, nutrition and lifestyle modification to decrease overall RF.            ITP Comments: ITP Comments    Row Name 01/09/19 1104 01/13/19 0936 01/16/19 1014   ITP Comments  Dr.  Fransico Him, Medical Director  pt started group exercise session today. pt tolerated light activity without difficulty. pt oriented to exercise equipment and safety routine. understanding verbalized.   30 day ITP review.  pt demonstrates eagerness to participate in CR opportunities.       Comments:

## 2019-02-14 ENCOUNTER — Encounter (HOSPITAL_COMMUNITY): Payer: Medicare Other

## 2019-02-14 ENCOUNTER — Ambulatory Visit (HOSPITAL_COMMUNITY): Payer: Medicare Other

## 2019-02-17 ENCOUNTER — Ambulatory Visit (HOSPITAL_COMMUNITY): Payer: Medicare Other

## 2019-02-17 ENCOUNTER — Encounter (HOSPITAL_COMMUNITY): Payer: Medicare Other

## 2019-02-19 ENCOUNTER — Encounter (HOSPITAL_COMMUNITY): Payer: Medicare Other

## 2019-02-19 ENCOUNTER — Ambulatory Visit (HOSPITAL_COMMUNITY): Payer: Medicare Other

## 2019-02-21 ENCOUNTER — Encounter (HOSPITAL_COMMUNITY): Payer: Medicare Other

## 2019-02-21 ENCOUNTER — Ambulatory Visit (HOSPITAL_COMMUNITY): Payer: Medicare Other

## 2019-02-21 ENCOUNTER — Telehealth (HOSPITAL_COMMUNITY): Payer: Self-pay

## 2019-02-21 NOTE — Telephone Encounter (Signed)
Phone call made to Pt to inquire about absence from Cardiac Rehab since 01/29/19. Pt stated he was on self quarantine due to potential contact with someone who tested positive for the coronavirus. Pt has missed many days in Cardiac Rehab and was encouraged to come back to Cardiac Rehab once he has availability in his schedule.   02/21/2019  2:41 PM   Deitra Mayo BS, ACSM CEP

## 2019-02-21 NOTE — Addendum Note (Signed)
Encounter addended by: Jeralyn Bennett on: 02/21/2019 2:43 PM  Actions taken: Flowsheet data copied forward, Visit Navigator Flowsheet section accepted

## 2019-02-21 NOTE — Addendum Note (Signed)
Encounter addended by: Lowell Guitar, RN on: 02/21/2019 3:48 PM  Actions taken: Visit Navigator Flowsheet section accepted

## 2019-02-24 ENCOUNTER — Ambulatory Visit (HOSPITAL_COMMUNITY): Payer: Medicare Other

## 2019-02-24 ENCOUNTER — Encounter (HOSPITAL_COMMUNITY): Payer: Medicare Other

## 2019-02-26 ENCOUNTER — Ambulatory Visit (HOSPITAL_COMMUNITY): Payer: Medicare Other

## 2019-02-26 ENCOUNTER — Encounter (HOSPITAL_COMMUNITY): Payer: Medicare Other

## 2019-02-27 NOTE — Addendum Note (Signed)
Encounter addended by: Ivonne Andrew, RD on: 02/27/2019 1:27 PM  Actions taken: Flowsheet data copied forward, Visit Navigator Flowsheet section accepted

## 2019-02-28 ENCOUNTER — Ambulatory Visit (HOSPITAL_COMMUNITY): Payer: Medicare Other

## 2019-02-28 ENCOUNTER — Encounter (HOSPITAL_COMMUNITY): Payer: Medicare Other

## 2019-02-28 NOTE — Progress Notes (Signed)
Discharge Progress Report  Patient Details  Name: [name] MRN: [id number] Date of Birth: Apr 25, 1951 Referring Provider:   Flowsheet Row CARDIAC REHAB PHASE II ORIENTATION from 01/09/2019 in [place]  Referring Provider  Dr. [name]       Number of Visits: 8   Reason for Discharge:  Early Exit:  Personal; self quarantine for COVID 19 precautions.   Smoking History:  Social History   Tobacco Use  Smoking Status Former Smoker  . Last attempt to quit: 02/12/1989  . Years since quitting: 30.0  Smokeless Tobacco Never Used    Diagnosis:  Status post coronary artery stent placement  ADL [facility]:   Initial Exercise Prescription: Initial Exercise Prescription - 01/09/19 1100    Date of Initial Exercise RX and Referring Provider          Date  01/09/19    Referring Provider  Dr. [name]    Expected Discharge Date  04/18/19        Bike          Level  1    Minutes  10    METs  3.36        NuStep          Level  3    SPM  85    Minutes  10    METs  4.4        Track          Laps  12    Minutes  10    METs  3.07        Prescription Details          Frequency (times per week)  3    Duration  Progress to 30 minutes of continuous aerobic without signs/symptoms of physical distress        Intensity          THRR 40-80% of Max Heartrate  62-124    Ratings of Perceived Exertion  11-13        Progression          Progression  Continue to progress workloads to maintain intensity without signs/symptoms of physical distress.        Resistance Training          Training Prescription  Yes    Weight  4 lbs.     Reps  10-15           Discharge Exercise Prescription (Final Exercise Prescription Changes): Exercise Prescription Changes - 02/04/19 1300    Response to Exercise          Blood Pressure (Admit)  120/67    Blood Pressure (Exercise)  134/84    Blood Pressure (Exit)  118/72    Heart Rate (Admit)  69 bpm    Heart Rate (Exercise)  101 bpm    Heart Rate (Exit)  64 bpm    Rating of Perceived Exertion (Exercise)  15    Symptoms  None    Duration  Progress to 30 minutes of  aerobic without signs/symptoms of physical distress    Intensity  THRR unchanged        Progression          Progression  Continue to progress workloads to maintain intensity without signs/symptoms of physical distress.    Average METs  3.6        Resistance Training          Training Prescription  No  Bike          Level  1    Minutes  10    METs  3.37        NuStep          Level  3    SPM  85    Minutes  10    METs  3.8        Track          Laps  16    Minutes  10    METs  3.78        Home Exercise Plan          Plans to continue exercise at  Home (comment)    Frequency  Add 3 additional days to program exercise sessions.    Initial Home Exercises Provided  01/29/19           Functional Capacity: 6 Minute Walk    6 Minute Walk    Row Name 01/09/19 1100   Phase  Initial   Distance  1671 feet   Walk Time  6 minutes   # of Rest Breaks  0   MPH  3.2   METS  4.56   RPE  11   VO2 Peak  15.97   Symptoms  Yes (comment)   Comments  Hip pain.    Resting HR  90 bpm   Resting BP  118/78   Resting Oxygen Saturation   98 %   Exercise Oxygen Saturation  during 6 min walk  99 %   Max Ex. HR  107 bpm   Max Ex. BP  152/80   2 Minute Post BP  122/70          Psychological, QOL, Others - Outcomes: PHQ 2/9: Depression screen PHQ 2/9 01/13/2019  Decreased Interest 0  Down, Depressed, Hopeless 0  PHQ - 2 Score 0    Quality of Life: Quality of Life - 02/28/19 1559    Quality of Life Scores          Health/Function Pre  20 %   pt verbalizes discouragment with recent cardiac event.    Socioeconomic Pre  21.71 %    Psych/Spiritual Pre  12.93 %   pt single parent caring for severely disabled adult child, providing total care.     Family Pre  15.1 %   pt divorced.    GLOBAL Pre   18.18 %   overall pt displays jaded outlook with effective coping skills. pt divorced parent of special needs total care adult son. )pt unreceptive to counseling or staff conversation about his low QOL scores.           Personal Goals: Goals established at orientation with interventions provided to work toward goal. Personal Goals and Risk Factors at Admission - 01/09/19 1059    Core Components/Risk Factors/Patient Goals on Admission           Weight Management  Yes;Weight Maintenance    Intervention  Weight Management: Develop a combined nutrition and exercise program designed to reach desired caloric intake, while maintaining appropriate intake of nutrient and fiber, sodium and fats, and appropriate energy expenditure required for the weight goal.;Weight Management: Provide education and appropriate resources to help participant work on and attain dietary goals.    Admit Weight  177 lb 0.5 oz (80.3 kg)    Expected Outcomes  Short Term: Continue to assess and modify interventions until short term weight is achieved;Long Term: Adherence to  nutrition and physical activity/exercise program aimed toward attainment of established weight goal;Weight Maintenance: Understanding of the daily nutrition guidelines, which includes 25-35% calories from fat, 7% or less cal from saturated fats, less than 200mg  cholesterol, less than 1.5gm of sodium, & 5 or more servings of fruits and vegetables daily;Understanding recommendations for meals to include 15-35% energy as protein, 25-35% energy from fat, 35-60% energy from carbohydrates, less than 200mg  of dietary cholesterol, 20-35 gm of total fiber daily;Understanding of distribution of calorie intake throughout the day with the consumption of 4-5 meals/snacks    Diabetes  Yes    Intervention  Provide education about signs/symptoms and action to take for hypo/hyperglycemia.;Provide education about proper nutrition, including hydration, and aerobic/resistive exercise  prescription along with prescribed medications to achieve blood glucose in normal ranges: Fasting glucose 65-99 mg/dL    Expected Outcomes  Short Term: Participant verbalizes understanding of the signs/symptoms and immediate care of hyper/hypoglycemia, proper foot care and importance of medication, aerobic/resistive exercise and nutrition plan for blood glucose control.;Long Term: Attainment of HbA1C < 7%.    Hypertension  Yes    Intervention  Provide education on lifestyle modifcations including regular physical activity/exercise, weight management, moderate sodium restriction and increased consumption of fresh fruit, vegetables, and low fat dairy, alcohol moderation, and smoking cessation.;Monitor prescription use compliance.    Expected Outcomes  Short Term: Continued assessment and intervention until BP is < 140/74mm HG in hypertensive participants. < 130/20mm HG in hypertensive participants with diabetes, heart failure or chronic kidney disease.;Long Term: Maintenance of blood pressure at goal levels.    Lipids  Yes    Intervention  Provide education and support for participant on nutrition & aerobic/resistive exercise along with prescribed medications to achieve LDL 70mg , HDL >40mg .    Expected Outcomes  Short Term: Participant states understanding of desired cholesterol values and is compliant with medications prescribed. Participant is following exercise prescription and nutrition guidelines.;Long Term: Cholesterol controlled with medications as prescribed, with individualized exercise RX and with personalized nutrition plan. Value goals: LDL < 70mg , HDL > 40 mg.    Stress  Yes    Intervention  Offer individual and/or small group education and counseling on adjustment to heart disease, stress management and health-related lifestyle change. Teach and support self-help strategies.;Refer participants experiencing significant psychosocial distress to appropriate mental health specialists for further  evaluation and treatment. When possible, include family members and significant others in education/counseling sessions.    Expected Outcomes  Short Term: Participant demonstrates changes in health-related behavior, relaxation and other stress management skills, ability to obtain effective social support, and compliance with psychotropic medications if prescribed.;Long Term: Emotional wellbeing is indicated by absence of clinically significant psychosocial distress or social isolation.            Personal Goals Discharge: Goals and Risk Factor Review    Core Components/Risk Factors/Patient Goals Review    Row Name 01/13/19 1007 01/16/19 1017 02/12/19 1733   Personal Goals Review  Weight Management/Obesity;Diabetes;Hypertension;Lipids;Stress  Weight Management/Obesity;Diabetes;Hypertension;Lipids;Stress  Weight Management/Obesity;Diabetes;Hypertension;Lipids;Stress   Review  pt with multiple CAD RF demonstrates tolerance to CR participation. pt demonstrates diabetes self care knowledge however needs nutritional reinforcement and guidance. pt persaonal goals are to maintain healthy weight and   pt with multiple CAD RF demonstrates tolerance to CR participation. pt demonstrates diabetes self care knowledge however needs nutritional reinforcement and guidance. pt persaonal goals are to maintain healthy weight and   pt with multiple CAD RF demonstrates tolerance to CR participation. pt demonstrates diabetes  self care knowledge however needs nutritional reinforcement and guidance. pt persaonal goals are to maintain healthy weight and    Expected Outcomes  pt will participate in CR exercise, nutrition and lifestyle modification to decrease overall RF.   pt will participate in CR exercise, nutrition and lifestyle modification to decrease overall RF.   pt will participate in CR exercise, nutrition and lifestyle modification to decrease overall RF.           Exercise Goals and Review: Exercise Goals     Exercise Goals    Row Name 01/09/19 1101   Increase Physical Activity  Yes   Intervention  Provide advice, education, support and counseling about physical activity/exercise needs.;Develop an individualized exercise prescription for aerobic and resistive training based on initial evaluation findings, risk stratification, comorbidities and participant's personal goals.   Expected Outcomes  Short Term: Attend rehab on a regular basis to increase amount of physical activity.;Long Term: Add in home exercise to make exercise part of routine and to increase amount of physical activity.;Long Term: Exercising regularly at least 3-5 days a week.   Increase Strength and Stamina  Yes   Intervention  Provide advice, education, support and counseling about physical activity/exercise needs.;Develop an individualized exercise prescription for aerobic and resistive training based on initial evaluation findings, risk stratification, comorbidities and participant's personal goals.   Expected Outcomes  Short Term: Increase workloads from initial exercise prescription for resistance, speed, and METs.   Able to understand and use rate of perceived exertion (RPE) scale  Yes   Intervention  Provide education and explanation on how to use RPE scale   Expected Outcomes  Short Term: Able to use RPE daily in rehab to express subjective intensity level;Long Term:  Able to use RPE to guide intensity level when exercising independently   Knowledge and understanding of Target Heart Rate Range (THRR)  Yes   Intervention  Provide education and explanation of THRR including how the numbers were predicted and where they are located for reference   Expected Outcomes  Short Term: Able to state/look up THRR;Long Term: Able to use THRR to govern intensity when exercising independently;Short Term: Able to use daily as guideline for intensity in rehab   Able to check pulse independently  Yes   Intervention  Provide education and  demonstration on how to check pulse in carotid and radial arteries.;Review the importance of being able to check your own pulse for safety during independent exercise   Expected Outcomes  Short Term: Able to explain why pulse checking is important during independent exercise;Long Term: Able to check pulse independently and accurately   Understanding of Exercise Prescription  Yes   Intervention  Provide education, explanation, and written materials on patient's individual exercise prescription   Expected Outcomes  Short Term: Able to explain program exercise prescription;Long Term: Able to explain home exercise prescription to exercise independently          Exercise Goals Re-Evaluation: Exercise Goals Re-Evaluation    Exercise Goal Re-Evaluation    Row Name 01/13/19 1012 01/27/19 0951 02/12/19 0812 02/21/19 1442   Exercise Goals Review  Increase Physical Activity;Increase Strength and Stamina;Able to understand and use rate of perceived exertion (RPE) scale;Knowledge and understanding of Target Heart Rate Range (THRR);Understanding of Exercise Prescription  Increase Physical Activity;Increase Strength and Stamina;Able to understand and use rate of perceived exertion (RPE) scale;Knowledge and understanding of Target Heart Rate Range (THRR);Understanding of Exercise Prescription  Increase Physical Activity;Increase Strength and Stamina;Able to understand and use  rate of perceived exertion (RPE) scale;Knowledge and understanding of Target Heart Rate Range (THRR);Understanding of Exercise Prescription  Increase Physical Activity;Increase Strength and Stamina;Able to understand and use rate of perceived exertion (RPE) scale;Knowledge and understanding of Target Heart Rate Range (THRR);Understanding of Exercise Prescription   Comments  Pt first day of exercise. Pt tolerated exercise well and understands RPE scale, THRR, and exercise Rx.   Reviewed HEP with Pt. Pt was responsive and understands goals, THRR,  NTG use, weather precautions, and end points of exercise. Encouraged Pt to walk 2-4 days a week for 30-45 minutes in addition to Cardiac Rehab.   Pt has been absent from Cardiac Rehab. Will follow up with Pt about attendance in the program.   Pt was discharged from Cardiac rehab due to absence. Pt had not been in attendance for 3 weeks. Pt is on self quarantine due to coronavirus.    Expected Outcomes  Will continue to monitor and progress Pt as tolerated.   Will continue to monitor and progress Pt as tolerated.   Will follow up wiht Pt.   no documentation          Nutrition & Weight - Outcomes: Pre Biometrics - 01/09/19 1101    Pre Biometrics          Height  6\' 3"  (1.905 m)    Weight  80.3 kg    Waist Circumference  35 inches    Hip Circumference  39 inches    Waist to Hip Ratio  0.9 %    BMI (Calculated)  22.13    Triceps Skinfold  25 mm    % Body Fat  24.7 %    Grip Strength  36 kg    Flexibility  0 in    Single Leg Stand  3.12 seconds            Nutrition: Nutrition Therapy & Goals - 01/09/19 1045    Nutrition Therapy          Diet  heart healthy, carb modified        Personal Nutrition Goals          Nutrition Goal  Pt to identify and limit food sources of saturated fat, trans fat, refined carbohydrates and sodium    Personal Goal #2  Pt to build a healthy plate including appropriate portion sizes of vegetables, fruits, whole grains, and lean protein in a heart healthy meal plan.    Personal Goal #3  Pt able to name foods that affect blood glucose    Personal Goal #4  Pt to identify food quantities necessary to achieve weight loss of 6-24 lb at graduation from cardiac rehab.         Intervention Plan          Intervention  Prescribe, educate and counsel regarding individualized specific dietary modifications aiming towards targeted core components such as weight, hypertension, lipid management, diabetes, heart failure and other comorbidities.    Expected Outcomes   Short Term Goal: Understand basic principles of dietary content, such as calories, fat, sodium, cholesterol and nutrients.;Long Term Goal: Adherence to prescribed nutrition plan.           Nutrition Discharge: Nutrition Assessments - 02/27/19 1324    MEDFICTS Scores          Pre Score  26    Post Score  --   pt did not complete post survey          Education Questionnaire Score: Knowledge Questionnaire  Score - 01/09/19 1058    Knowledge Questionnaire Score          Pre Score  18/24           Goals reviewed with patient; copy given to patient.

## 2019-02-28 NOTE — Addendum Note (Signed)
Encounter addended by: Lowell Guitar, RN on: 02/28/2019 4:20 PM  Actions taken: Clinical Note Signed

## 2019-02-28 NOTE — Addendum Note (Signed)
Encounter addended by: Lowell Guitar, RN on: 02/28/2019 4:02 PM  Actions taken: Flowsheet data copied forward, Visit Navigator Flowsheet section accepted

## 2019-03-03 ENCOUNTER — Encounter (HOSPITAL_COMMUNITY): Payer: Medicare Other

## 2019-03-03 ENCOUNTER — Ambulatory Visit (HOSPITAL_COMMUNITY): Payer: Medicare Other

## 2019-03-05 ENCOUNTER — Ambulatory Visit (HOSPITAL_COMMUNITY): Payer: Medicare Other

## 2019-03-05 ENCOUNTER — Encounter (HOSPITAL_COMMUNITY): Payer: Medicare Other

## 2019-03-07 ENCOUNTER — Ambulatory Visit (HOSPITAL_COMMUNITY): Payer: Medicare Other

## 2019-03-07 ENCOUNTER — Encounter (HOSPITAL_COMMUNITY): Payer: Medicare Other

## 2019-03-10 ENCOUNTER — Encounter (HOSPITAL_COMMUNITY): Payer: Medicare Other

## 2019-03-10 ENCOUNTER — Ambulatory Visit (HOSPITAL_COMMUNITY): Payer: Medicare Other

## 2019-03-12 ENCOUNTER — Ambulatory Visit (HOSPITAL_COMMUNITY): Payer: Medicare Other

## 2019-03-12 ENCOUNTER — Encounter (HOSPITAL_COMMUNITY): Payer: Medicare Other

## 2019-03-14 ENCOUNTER — Ambulatory Visit (HOSPITAL_COMMUNITY): Payer: Medicare Other

## 2019-03-14 ENCOUNTER — Encounter (HOSPITAL_COMMUNITY): Payer: Medicare Other

## 2019-03-17 ENCOUNTER — Encounter (HOSPITAL_COMMUNITY): Payer: Medicare Other

## 2019-03-17 ENCOUNTER — Ambulatory Visit (HOSPITAL_COMMUNITY): Payer: Medicare Other

## 2019-03-19 ENCOUNTER — Ambulatory Visit (HOSPITAL_COMMUNITY): Payer: Medicare Other

## 2019-03-19 ENCOUNTER — Encounter (HOSPITAL_COMMUNITY): Payer: Medicare Other

## 2019-03-19 ENCOUNTER — Telehealth (HOSPITAL_COMMUNITY): Payer: Self-pay | Admitting: Cardiac Rehabilitation

## 2019-03-19 NOTE — Telephone Encounter (Signed)
Pt phone call to inform of continued Outpatient Cardiac Rehab departmental closure for COVID 19 precautions. Future opening date to be determined. Left message on answering machine.  Dajai Wahlert, RN, BSN Cardiac Pulmonary Rehab 

## 2019-03-21 ENCOUNTER — Ambulatory Visit (HOSPITAL_COMMUNITY): Payer: Medicare Other

## 2019-03-21 ENCOUNTER — Encounter (HOSPITAL_COMMUNITY): Payer: Medicare Other

## 2019-03-24 ENCOUNTER — Ambulatory Visit (HOSPITAL_COMMUNITY): Payer: Medicare Other

## 2019-03-24 ENCOUNTER — Encounter (HOSPITAL_COMMUNITY): Payer: Medicare Other

## 2019-03-26 ENCOUNTER — Ambulatory Visit (HOSPITAL_COMMUNITY): Payer: Medicare Other

## 2019-03-26 ENCOUNTER — Encounter (HOSPITAL_COMMUNITY): Payer: Medicare Other

## 2019-03-28 ENCOUNTER — Encounter (HOSPITAL_COMMUNITY): Payer: Medicare Other

## 2019-03-28 ENCOUNTER — Ambulatory Visit (HOSPITAL_COMMUNITY): Payer: Medicare Other

## 2019-03-31 ENCOUNTER — Ambulatory Visit (HOSPITAL_COMMUNITY): Payer: Medicare Other

## 2019-03-31 ENCOUNTER — Encounter (HOSPITAL_COMMUNITY): Payer: Medicare Other

## 2019-04-02 ENCOUNTER — Ambulatory Visit (HOSPITAL_COMMUNITY): Payer: Medicare Other

## 2019-04-02 ENCOUNTER — Encounter (HOSPITAL_COMMUNITY): Payer: Medicare Other

## 2019-04-04 ENCOUNTER — Ambulatory Visit (HOSPITAL_COMMUNITY): Payer: Medicare Other

## 2019-04-04 ENCOUNTER — Encounter (HOSPITAL_COMMUNITY): Payer: Medicare Other

## 2019-04-07 ENCOUNTER — Ambulatory Visit (HOSPITAL_COMMUNITY): Payer: Medicare Other

## 2019-04-07 ENCOUNTER — Encounter (HOSPITAL_COMMUNITY): Payer: Medicare Other

## 2019-04-09 ENCOUNTER — Encounter (HOSPITAL_COMMUNITY): Payer: Medicare Other

## 2019-04-09 ENCOUNTER — Ambulatory Visit (HOSPITAL_COMMUNITY): Payer: Medicare Other

## 2019-04-11 ENCOUNTER — Ambulatory Visit (HOSPITAL_COMMUNITY): Payer: Medicare Other

## 2019-04-11 ENCOUNTER — Encounter (HOSPITAL_COMMUNITY): Payer: Medicare Other

## 2019-04-14 ENCOUNTER — Encounter (HOSPITAL_COMMUNITY): Payer: Medicare Other

## 2019-04-14 ENCOUNTER — Ambulatory Visit (HOSPITAL_COMMUNITY): Payer: Medicare Other

## 2019-04-16 ENCOUNTER — Ambulatory Visit (HOSPITAL_COMMUNITY): Payer: Medicare Other

## 2019-04-16 ENCOUNTER — Encounter (HOSPITAL_COMMUNITY): Payer: Medicare Other

## 2019-04-18 ENCOUNTER — Ambulatory Visit (HOSPITAL_COMMUNITY): Payer: Medicare Other

## 2019-08-20 DIAGNOSIS — Z20828 Contact with and (suspected) exposure to other viral communicable diseases: Secondary | ICD-10-CM | POA: Diagnosis not present

## 2019-08-25 DIAGNOSIS — Z20828 Contact with and (suspected) exposure to other viral communicable diseases: Secondary | ICD-10-CM | POA: Diagnosis not present

## 2019-09-18 DIAGNOSIS — Z20828 Contact with and (suspected) exposure to other viral communicable diseases: Secondary | ICD-10-CM | POA: Diagnosis not present

## 2019-09-19 DIAGNOSIS — Z23 Encounter for immunization: Secondary | ICD-10-CM | POA: Diagnosis not present

## 2020-01-08 ENCOUNTER — Other Ambulatory Visit: Payer: Self-pay | Admitting: Interventional Cardiology

## 2020-01-24 ENCOUNTER — Other Ambulatory Visit: Payer: Self-pay | Admitting: Cardiology

## 2020-01-24 DIAGNOSIS — Z23 Encounter for immunization: Secondary | ICD-10-CM | POA: Diagnosis not present

## 2020-02-02 ENCOUNTER — Other Ambulatory Visit: Payer: Self-pay | Admitting: Cardiology

## 2020-02-14 DIAGNOSIS — Z23 Encounter for immunization: Secondary | ICD-10-CM | POA: Diagnosis not present

## 2020-02-22 ENCOUNTER — Other Ambulatory Visit: Payer: Self-pay | Admitting: Cardiology

## 2020-02-29 ENCOUNTER — Other Ambulatory Visit: Payer: Self-pay | Admitting: Cardiology

## 2020-03-04 ENCOUNTER — Other Ambulatory Visit: Payer: Self-pay | Admitting: Cardiology

## 2020-03-17 DIAGNOSIS — H35033 Hypertensive retinopathy, bilateral: Secondary | ICD-10-CM | POA: Diagnosis not present

## 2020-03-17 DIAGNOSIS — H43393 Other vitreous opacities, bilateral: Secondary | ICD-10-CM | POA: Diagnosis not present

## 2020-03-17 DIAGNOSIS — H2513 Age-related nuclear cataract, bilateral: Secondary | ICD-10-CM | POA: Diagnosis not present

## 2020-03-17 DIAGNOSIS — E119 Type 2 diabetes mellitus without complications: Secondary | ICD-10-CM | POA: Diagnosis not present

## 2020-03-24 ENCOUNTER — Other Ambulatory Visit: Payer: Self-pay | Admitting: Cardiology

## 2020-03-31 ENCOUNTER — Other Ambulatory Visit: Payer: Self-pay | Admitting: Interventional Cardiology

## 2020-04-02 ENCOUNTER — Other Ambulatory Visit: Payer: Self-pay | Admitting: Interventional Cardiology

## 2020-04-06 ENCOUNTER — Other Ambulatory Visit: Payer: Self-pay | Admitting: Interventional Cardiology

## 2020-04-15 DIAGNOSIS — E049 Nontoxic goiter, unspecified: Secondary | ICD-10-CM | POA: Diagnosis not present

## 2020-04-15 DIAGNOSIS — E7800 Pure hypercholesterolemia, unspecified: Secondary | ICD-10-CM | POA: Diagnosis not present

## 2020-04-15 DIAGNOSIS — I10 Essential (primary) hypertension: Secondary | ICD-10-CM | POA: Diagnosis not present

## 2020-04-15 DIAGNOSIS — E1165 Type 2 diabetes mellitus with hyperglycemia: Secondary | ICD-10-CM | POA: Diagnosis not present

## 2020-04-19 ENCOUNTER — Other Ambulatory Visit: Payer: Self-pay | Admitting: Interventional Cardiology

## 2020-04-24 ENCOUNTER — Other Ambulatory Visit: Payer: Self-pay | Admitting: Cardiology

## 2020-04-26 NOTE — Telephone Encounter (Signed)
Outpatient Medication Detail   Disp Refills Start End   clopidogrel (PLAVIX) 75 MG tablet 30 tablet 3 01/26/2020    Sig - Route: Take 1 tablet (75 mg total) by mouth daily. Please make overdue appt with Dr. [name] before anymore refills. 1st attempt - Oral   Sent to pharmacy as: clopidogrel (PLAVIX) 75 MG tablet   Notes to Pharmacy: Please call our office to schedule an overdue appointment with Dr. [name] before anymore refills. [phone]. Thank you 1st attempt   E-Prescribing Status: Receipt confirmed by pharmacy (01/26/2020  1:59 PM EST)   Pharmacy  [id number] - [place], [place] [place] AT [address]

## 2020-04-27 ENCOUNTER — Other Ambulatory Visit: Payer: Self-pay | Admitting: Interventional Cardiology

## 2020-05-04 NOTE — Progress Notes (Signed)
Cardiology Office Note:    Date:  05/05/2020   ID:  [name], DOB Oct 17, 1951, MRN [id number]  PCP:  Dennis Kid, MD  Cardiologist:  Dennis Grooms, MD   Referring MD: Dennis Kid, MD   Chief Complaint  Patient presents with  . Coronary Artery Disease    History of Present Illness:    [name] is a 69 y.o. 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   Doing well from the standpoint of coronary disease symptoms.  His son is now in graduate school at [name].  He pretty much is living alone at the current time.  He is also somewhat sad because his brother, prominent Psychologist, sport and exercise at [name] has leukemia and will need a stem cell transplant.  [name] is not been as compliant with walking requirements.  He has not needed nitroglycerin.  He denies orthopnea and PND.  There is no lower extremity swelling.  [name] is the endocrinologist to follows diabetes.  Last hemoglobin A1c was 7.3 in February 2020.  There is not a more recent lab available on the [facility].  Past Medical History:  Diagnosis Date  . Coronary artery disease CARDIOLOGIST- DR [name]  . Diabetes mellitus without complication ([place]) [id number]   oral,insulin management  . History of acute inferior wall myocardial infarction    03-01-2012  STEMI  . History of kidney stones   . History of MI (myocardial infarction) 1998    . S/P drug eluting coronary stent placement   . Ureteral calculi left    Past Surgical History:  Procedure Laterality Date  . CORONARY ANGIOPLASTY WITH STENT PLACEMENT  1998  [facility])   [place]  . CORONARY  ANGIOPLASTY WITH STENT PLACEMENT  03/03/2011   STENTING OF IN-STENT RESTENOSIS OF LEFT CIRDUMFLEX  (drug-eluting)  . CORONARY STENT INTERVENTION N/A 11/11/2018   Procedure: CORONARY STENT INTERVENTION;  Surgeon: Dennis Crome, MD;  Location: [place] [facility];  Service: Cardiovascular;  Laterality: N/A;  . LEFT HEART CATH AND CORONARY ANGIOGRAPHY N/A 11/11/2018   Procedure: LEFT HEART CATH AND CORONARY ANGIOGRAPHY;  Surgeon: Dennis Crome, MD;  Location: [place] [facility];  Service: Cardiovascular;  Laterality: N/A;    Current Medications: Current Meds  Medication Sig  . aspirin EC 81 MG tablet Take 1 tablet (81 mg total) by mouth daily.  [name] atorvastatin (LIPITOR) 80 MG tablet TAKE 1 TABLET DAILY. PLEASE MAKE OVERDUE APPT WITH DR. [name] BEFORE ANYMORE REFILLS. 1ST ATTEMPT  . calcium carbonate (TUMS EX) 750 MG chewable tablet Chew 2 tablets by mouth as needed for heartburn.  . clopidogrel (PLAVIX) 75 MG tablet Take 1 tablet (75 mg total) by mouth daily. Please make overdue appt with Dr. [name] before anymore refills. 1st attempt  . fish oil-omega-3 fatty acids 1000 MG capsule Take 4 g by mouth daily.   [name] LEVEMIR FLEXTOUCH 100 UNIT/ML Pen Inject 18-30 Units into the skin 2 (two) times daily. Taking 18 units in the AM and 30 units at night.  . losartan (COZAAR) 50 MG tablet TAKE 1 TABLET BY MOUTH DAILY. PLEASE SCHEDULE APPOINTMENT FOR FUTURE REFILLS. 3RD & FINAL ATTEMPT!  . metFORMIN (  GLUCOPHAGE) 500 MG tablet Take 1,000 mg by mouth 2 (two) times daily with a meal.   . metoprolol tartrate (LOPRESSOR) 50 MG tablet Take 1 tablet (50 mg total) by mouth 2 (two) times daily. Patient must keep 05/05/2020 appointment for further refills  . Multiple Vitamin (MULTIVITAMIN) tablet Take 1 tablet by mouth daily.  . nitroGLYCERIN (NITROSTAT) 0.4 MG SL tablet Place 1 tablet (0.4 mg total) under the tongue every 5 (five) minutes x 3 doses as needed for chest pain.  . Turmeric 500 MG CAPS Take 500 mg by mouth as  needed (joint pain).     Allergies:   Patient has no known allergies.   Social History   Socioeconomic History  . Marital status: Married    Spouse name: Not on file  . Number of children: Not on file  . Years of education: Not on file  . Highest education level: Not on file  Occupational History  . Not on file  Tobacco Use  . Smoking status: Former Smoker    Quit date: 02/12/1989    Years since quitting: 31.2  . Smokeless tobacco: Never Used  Substance and Sexual Activity  . Alcohol use: Yes    Alcohol/week: 0.0 standard drinks    Comment: rare  . Drug use: No  . Sexual activity: Not on file  Other Topics Concern  . Not on file  Social History Narrative  . Not on file   Social Determinants of Health   Financial Resource Strain:   . Difficulty of Paying Living Expenses:   Food Insecurity:   . Worried About Charity fundraiser in the Last Year:   . Arboriculturist in the Last Year:   Transportation Needs:   . Film/video editor (Medical):   [name] Lack of Transportation (Non-Medical):   Physical Activity:   . Days of Exercise per Week:   . Minutes of Exercise per Session:   Stress:   . Feeling of Stress :   Social Connections:   . Frequency of Communication with Friends and Family:   . Frequency of Social Gatherings with Friends and Family:   . Attends Religious Services:   . Active Member of Clubs or Organizations:   . Attends Archivist Meetings:   [name] Marital Status:      Family History: The patient's family history includes COPD in his mother; Diabetes in his brother; Healthy in his father; Heart attack in his brother; Kidney disease in his mother.  ROS:   Please see the history of present illness.    Bilateral Dupuytren's contractures are impacting quality of life.  Not exercising regularly.  States he has been started returning to regular exercise and swimming at the [facility].  All other systems reviewed and are negative.  EKGs/Labs/Other Studies  Reviewed:    The following studies were reviewed today: No recent or new imaging data.  EKG:  EKG sinus rhythm, slight PR segment depression in the inferior leads II to III and aVF.  No change compared to prior.  Recent Labs: No results found for requested labs within last 8760 hours.  Recent Lipid Panel    Component Value Date/Time   CHOL 112 11/11/2018 0021   TRIG 266 (H) 11/11/2018 0021   HDL 29 (L) 11/11/2018 0021   CHOLHDL 3.9 11/11/2018 0021   VLDL 53 (H) 11/11/2018 0021   LDLCALC 30 11/11/2018 0021    Physical Exam:    VS:  BP (!) 142/82  Pulse 62   Ht 6\' 3"  (1.905 m)   Wt 171 lb 6.4 oz (77.7 kg)   SpO2 98%   BMI 21.42 kg/m     Wt Readings from Last 3 Encounters:  05/05/20 171 lb 6.4 oz (77.7 kg)  01/09/19 177 lb 0.5 oz (80.3 kg)  11/25/18 180 lb (81.6 kg)     GEN: Slender.  Healthy appearing.. No acute distress HEENT: Normal NECK: No JVD. LYMPHATICS: No lymphadenopathy CARDIAC:  RRR without murmur, gallop, or edema. VASCULAR:  Normal Pulses. No bruits. RESPIRATORY:  Clear to auscultation without rales, wheezing or rhonchi  ABDOMEN: Soft, non-tender, non-distended, No pulsatile mass, MUSCULOSKELETAL: No deformity  SKIN: Warm and dry NEUROLOGIC:  Alert and oriented x 3 PSYCHIATRIC:  Normal affect   ASSESSMENT:    1. Coronary artery disease involving native coronary artery of native heart without angina pectoris   2. Essential hypertension   3. Pure hypercholesterolemia   4. Controlled type 2 diabetes mellitus with other circulatory complication, without long-term current use of insulin ([place])   5. Educated about COVID-19 virus infection    PLAN:    In order of problems listed above:  1. Secondary prevention discussed.  Needs to pay close attention to diabetes control. 2. Blood pressure is higher than we would like.  Home recordings are better and usually less than 130/80 mmHg.  He will follow these home and give [place] some future recordings. 3. LDL  cholesterol target less than 70.  States that Dr. Soyla Murphy has done recent blood work but I do not have it available to review.  Continue Lipitor 80 mg/day. 4. Hemoglobin A1c needs to be less than 7.  Consider adding an SGLT2. 5. COVID-19 vaccine has been completed.  Overall education and awareness concerning primary/secondary risk prevention was discussed in detail: LDL less than 70, hemoglobin A1c less than 7, blood pressure target less than 130/80 mmHg, >150 minutes of moderate aerobic activity per week, avoidance of smoking, weight control (via diet and exercise), and continued surveillance/management of/for obstructive sleep apnea.     Medication Adjustments/Labs and Tests Ordered: Current medicines are reviewed at length with the patient today.  Concerns regarding medicines are outlined above.  Orders Placed This Encounter  Procedures  . EKG 12-Lead   No orders of the defined types were placed in this encounter.   Patient Instructions  Medication Instructions:  Your physician recommends that you continue on your current medications as directed. Please refer to the Current Medication list given to you today.  *If you need a refill on your cardiac medications before your next appointment, please call your pharmacy*   Lab Work: None If you have labs (blood work) drawn today and your tests are completely normal, you will receive your results only by: [name] MyChart Message (if you have MyChart) OR . A paper copy in the mail If you have any lab test that is abnormal or we need to change your treatment, we will call you to review the results.   Testing/Procedures: None   Follow-Up: At [facility], you and your health needs are our priority.  As part of our continuing mission to provide you with exceptional heart care, we have created designated Provider Care Teams.  These Care Teams include your primary Cardiologist (physician) and Advanced Practice Providers (APPs -  Physician  Assistants and Nurse Practitioners) who all work together to provide you with the care you need, when you need it.  We recommend signing up for the patient  portal called "MyChart".  Sign up information is provided on this After Visit Summary.  MyChart is used to connect with patients for Virtual Visits (Telemedicine).  Patients are able to view lab/test results, encounter notes, upcoming appointments, etc.  Non-urgent messages can be sent to your provider as well.   To learn more about what you can do with MyChart, go to [email or web address].    Your next appointment:   12 month(s)  The format for your next appointment:   In Person  Provider:   You may see Dennis Grooms, MD or one of the following Advanced Practice Providers on your designated Care Team:    Truitt Merle, NP  Cecilie Kicks, NP  Kathyrn Drown, NP    Other Instructions      Signed, Dennis Grooms, MD  05/05/2020 5:13 PM    [place]

## 2020-05-05 ENCOUNTER — Encounter: Payer: Self-pay | Admitting: Interventional Cardiology

## 2020-05-05 ENCOUNTER — Ambulatory Visit (INDEPENDENT_AMBULATORY_CARE_PROVIDER_SITE_OTHER): Payer: Medicare Other | Admitting: Interventional Cardiology

## 2020-05-05 ENCOUNTER — Other Ambulatory Visit: Payer: Self-pay

## 2020-05-05 VITALS — BP 142/82 | HR 62 | Ht 75.0 in | Wt 171.4 lb

## 2020-05-05 DIAGNOSIS — E1159 Type 2 diabetes mellitus with other circulatory complications: Secondary | ICD-10-CM | POA: Diagnosis not present

## 2020-05-05 DIAGNOSIS — E7800 Pure hypercholesterolemia, unspecified: Secondary | ICD-10-CM | POA: Diagnosis not present

## 2020-05-05 DIAGNOSIS — I10 Essential (primary) hypertension: Secondary | ICD-10-CM | POA: Diagnosis not present

## 2020-05-05 DIAGNOSIS — Z7189 Other specified counseling: Secondary | ICD-10-CM

## 2020-05-05 DIAGNOSIS — I2510 Atherosclerotic heart disease of native coronary artery without angina pectoris: Secondary | ICD-10-CM | POA: Diagnosis not present

## 2020-05-05 NOTE — Patient Instructions (Signed)

## 2020-05-22 ENCOUNTER — Other Ambulatory Visit: Payer: Self-pay | Admitting: Cardiology

## 2020-05-22 ENCOUNTER — Other Ambulatory Visit: Payer: Self-pay | Admitting: Interventional Cardiology

## 2020-06-01 ENCOUNTER — Other Ambulatory Visit: Payer: Self-pay | Admitting: Cardiology

## 2020-09-04 DIAGNOSIS — Z23 Encounter for immunization: Secondary | ICD-10-CM | POA: Diagnosis not present

## 2020-09-16 DIAGNOSIS — Z23 Encounter for immunization: Secondary | ICD-10-CM | POA: Diagnosis not present

## 2020-11-11 ENCOUNTER — Other Ambulatory Visit: Payer: Self-pay | Admitting: Interventional Cardiology

## 2020-11-11 DIAGNOSIS — E7800 Pure hypercholesterolemia, unspecified: Secondary | ICD-10-CM | POA: Diagnosis not present

## 2020-11-11 DIAGNOSIS — I10 Essential (primary) hypertension: Secondary | ICD-10-CM | POA: Diagnosis not present

## 2020-11-11 DIAGNOSIS — E049 Nontoxic goiter, unspecified: Secondary | ICD-10-CM | POA: Diagnosis not present

## 2020-11-11 DIAGNOSIS — E1165 Type 2 diabetes mellitus with hyperglycemia: Secondary | ICD-10-CM | POA: Diagnosis not present

## 2020-11-11 DIAGNOSIS — R809 Proteinuria, unspecified: Secondary | ICD-10-CM | POA: Diagnosis not present

## 2021-01-03 DIAGNOSIS — Z794 Long term (current) use of insulin: Secondary | ICD-10-CM | POA: Diagnosis not present

## 2021-01-03 DIAGNOSIS — I10 Essential (primary) hypertension: Secondary | ICD-10-CM | POA: Diagnosis not present

## 2021-01-03 DIAGNOSIS — E782 Mixed hyperlipidemia: Secondary | ICD-10-CM | POA: Diagnosis not present

## 2021-01-03 DIAGNOSIS — E1169 Type 2 diabetes mellitus with other specified complication: Secondary | ICD-10-CM | POA: Diagnosis not present

## 2021-01-03 DIAGNOSIS — Z125 Encounter for screening for malignant neoplasm of prostate: Secondary | ICD-10-CM | POA: Diagnosis not present

## 2021-01-03 DIAGNOSIS — M720 Palmar fascial fibromatosis [Dupuytren]: Secondary | ICD-10-CM | POA: Diagnosis not present

## 2021-01-03 DIAGNOSIS — Z1211 Encounter for screening for malignant neoplasm of colon: Secondary | ICD-10-CM | POA: Diagnosis not present

## 2021-01-03 DIAGNOSIS — I25810 Atherosclerosis of coronary artery bypass graft(s) without angina pectoris: Secondary | ICD-10-CM | POA: Diagnosis not present

## 2021-04-26 DIAGNOSIS — Z23 Encounter for immunization: Secondary | ICD-10-CM | POA: Diagnosis not present

## 2021-05-04 ENCOUNTER — Other Ambulatory Visit: Payer: Self-pay | Admitting: Interventional Cardiology

## 2021-05-05 DIAGNOSIS — Z20822 Contact with and (suspected) exposure to covid-19: Secondary | ICD-10-CM | POA: Diagnosis not present

## 2021-05-22 ENCOUNTER — Other Ambulatory Visit: Payer: Self-pay | Admitting: Cardiology

## 2021-06-04 ENCOUNTER — Other Ambulatory Visit: Payer: Self-pay | Admitting: Interventional Cardiology

## 2021-06-06 NOTE — Telephone Encounter (Signed)
Rx(s) sent to pharmacy electronically.  

## 2021-06-21 ENCOUNTER — Other Ambulatory Visit: Payer: Self-pay | Admitting: Cardiology

## 2021-07-02 ENCOUNTER — Other Ambulatory Visit: Payer: Self-pay | Admitting: Interventional Cardiology

## 2021-07-11 ENCOUNTER — Other Ambulatory Visit: Payer: Self-pay | Admitting: Interventional Cardiology

## 2021-07-13 DIAGNOSIS — I10 Essential (primary) hypertension: Secondary | ICD-10-CM | POA: Diagnosis not present

## 2021-07-13 DIAGNOSIS — E1165 Type 2 diabetes mellitus with hyperglycemia: Secondary | ICD-10-CM | POA: Diagnosis not present

## 2021-07-13 DIAGNOSIS — E7800 Pure hypercholesterolemia, unspecified: Secondary | ICD-10-CM | POA: Diagnosis not present

## 2021-07-23 ENCOUNTER — Other Ambulatory Visit: Payer: Self-pay | Admitting: Interventional Cardiology

## 2021-07-26 ENCOUNTER — Other Ambulatory Visit: Payer: Self-pay | Admitting: Interventional Cardiology

## 2021-08-03 ENCOUNTER — Other Ambulatory Visit: Payer: Self-pay | Admitting: Interventional Cardiology

## 2021-08-06 ENCOUNTER — Other Ambulatory Visit: Payer: Self-pay | Admitting: Interventional Cardiology

## 2021-08-18 ENCOUNTER — Telehealth: Payer: Self-pay | Admitting: Interventional Cardiology

## 2021-08-18 MED ORDER — CLOPIDOGREL BISULFATE 75 MG PO TABS: 75.0000 mg | ORAL_TABLET | Freq: Every day | ORAL | 1 refills | Status: DC

## 2021-08-18 MED ORDER — ATORVASTATIN CALCIUM 80 MG PO TABS: 80.0000 mg | ORAL_TABLET | Freq: Every day | ORAL | 1 refills | Status: DC

## 2021-08-18 MED ORDER — LOSARTAN POTASSIUM 50 MG PO TABS: 50.0000 mg | ORAL_TABLET | Freq: Every day | ORAL | 1 refills | Status: DC

## 2021-08-18 MED ORDER — METOPROLOL TARTRATE 50 MG PO TABS: 50.0000 mg | ORAL_TABLET | Freq: Two times a day (BID) | ORAL | 1 refills | Status: DC

## 2021-08-18 NOTE — Telephone Encounter (Signed)
Pt's medications were sent to pt's pharmacy as requested. Confirmation received.  

## 2021-08-18 NOTE — Telephone Encounter (Signed)
*  STAT* If patient is at the pharmacy, call can be transferred to refill team.   1. Which medications need to be refilled? (please list name of each medication and dose if known) atorvastatin (LIPITOR) 80 MG tablet clopidogrel (PLAVIX) 75 MG tablet losartan (COZAAR) 50 MG tablet metoprolol tartrate (LOPRESSOR) 50 MG tablet  2. Which pharmacy/location (including street and city if local pharmacy) is medication to be sent to? [id number] [place] [place] [address]. Do they need a 30 day or 90 day supply? [address]

## 2021-09-24 DIAGNOSIS — Z23 Encounter for immunization: Secondary | ICD-10-CM | POA: Diagnosis not present

## 2021-11-18 ENCOUNTER — Encounter: Payer: Self-pay | Admitting: Interventional Cardiology

## 2022-01-07 ENCOUNTER — Other Ambulatory Visit: Payer: Self-pay | Admitting: Interventional Cardiology

## 2022-01-13 DIAGNOSIS — I10 Essential (primary) hypertension: Secondary | ICD-10-CM | POA: Diagnosis not present

## 2022-01-13 DIAGNOSIS — E1165 Type 2 diabetes mellitus with hyperglycemia: Secondary | ICD-10-CM | POA: Diagnosis not present

## 2022-01-19 ENCOUNTER — Ambulatory Visit: Payer: Medicare Other | Admitting: Interventional Cardiology

## 2022-03-01 ENCOUNTER — Other Ambulatory Visit: Payer: Self-pay | Admitting: Interventional Cardiology

## 2022-03-12 NOTE — Progress Notes (Signed)
?Cardiology Office Note:   ? ?Date:  03/13/2022  ? ?ID:  [name], DOB [birth year], MRN [id number] ? ?PCP:  Pcp, No  ?Cardiologist:  Dennis Grooms, MD  ? ?Referring MD: Dennis Kid, MD  ? ?Chief Complaint  ?Patient presents with  ? Coronary Artery Disease  ? Hyperlipidemia  ? ? ?History of Present Illness:   ? ?[name] is a 71 y.o. male with a hx of  CAD, prior PTCA of the RCA during MI remote in 1998, subsequent RCA stent during the acute MI with DES placement in 2013, and staged PCI with stenting of the circumflex in 2013. Other issues include history of depression (due to multiple family member deaths), hypertension, diabetes, and hyperlipidemia. He cares for a son with autism.  ? ?[name] had COVID around Memorial Day 2022.  Took several months to get over the dyspnea associated.  He now feels fully recovered. ? ?During the COVID infection and subsequently, he has not had angina, palpitations, syncope, edema, or other cardiac complaints.  No peripheral edema has been noted.  He denies orthopnea. ? ?Past Medical History:  ?Diagnosis Date  ? Coronary artery disease CARDIOLOGIST- DR [name]  ? Diabetes mellitus without complication ([place]) 3734  ? oral,insulin management  ? History of acute inferior wall myocardial infarction   ? 03-01-2012  STEMI  ? History of kidney stones   ? History of MI (myocardial infarction) 1998    ? S/P drug eluting coronary stent placement   ? Ureteral calculi left  ? ? ?Past Surgical History:  ?Procedure Laterality Date  ? CORONARY ANGIOPLASTY WITH STENT PLACEMENT  1998  [facility])  ? LEFT CIRCUMFLEX STENTIING  ? CORONARY ANGIOPLASTY WITH STENT PLACEMENT  03/03/2011  ? STENTING OF IN-STENT RESTENOSIS OF LEFT CIRDUMFLEX  (drug-eluting)  ? CORONARY STENT INTERVENTION N/A 11/11/2018  ? Procedure: CORONARY STENT INTERVENTION;  Surgeon: Belva Crome, MD;  Location: [place] [facility];  Service: Cardiovascular;  Laterality: N/A;  ? LEFT HEART CATH AND CORONARY  ANGIOGRAPHY N/A 11/11/2018  ? Procedure: LEFT HEART CATH AND CORONARY ANGIOGRAPHY;  Surgeon: Belva Crome, MD;  Location: [place] [facility];  Service: Cardiovascular;  Laterality: N/A;  ? ? ?Current Medications: ?Current Meds  ?Medication Sig  ? aspirin EC 81 MG tablet Take 1 tablet (81 mg total) by mouth daily.  ? atorvastatin (LIPITOR) 80 MG tablet TAKE 1 TABLET BY MOUTH EVERY DAY  ? calcium carbonate (TUMS EX) 750 MG chewable tablet Chew 2 tablets by mouth as needed for heartburn.  ? clopidogrel (PLAVIX) 75 MG tablet TAKE 1 TABLET BY MOUTH EVERY DAY  ? fish oil-omega-3 fatty acids 1000 MG capsule Take 4 g by mouth daily.   ? insulin detemir (LEVEMIR) 100 UNIT/ML injection Inject 18-32 Units into the skin daily. Pt take 18 unit in the morning, and 32 unit at night.  ? losartan (COZAAR) 50 MG tablet Take 1 tablet (50 mg total) by mouth daily.  ? metFORMIN (GLUCOPHAGE) 500 MG tablet Take 1,000 mg by mouth 2 (two) times daily with a meal.   ? metoprolol tartrate (LOPRESSOR) 50 MG tablet TAKE 1 TABLET BY MOUTH TWICE A DAY  ? Multiple Vitamin (MULTIVITAMIN) tablet Take 1 tablet by mouth daily.  ? nitroGLYCERIN (NITROSTAT) 0.4 MG SL tablet Place 1 tablet (0.4 mg total) under the tongue every 5 (five) minutes x 3 doses as needed for chest pain.  ?  ? ?Allergies:   Patient has no known  allergies.  ? ?Social History  ? ?Socioeconomic History  ? Marital status: Married  ?  Spouse name: Not on file  ? Number of children: Not on file  ? Years of education: Not on file  ? Highest education level: Not on file  ?Occupational History  ? Not on file  ?Tobacco Use  ? Smoking status: Former  ?  Types: Cigarettes  ?  Quit date: 02/12/1989  ?  Years since quitting: 33.1  ? Smokeless tobacco: Never  ?Vaping Use  ? Vaping Use: Never used  ?Substance and Sexual Activity  ? Alcohol use: Yes  ?  Alcohol/week: 0.0 standard drinks  ?  Comment: rare  ? Drug use: No  ? Sexual activity: Not on file  ?Other Topics Concern  ? Not on file  ?Social  History Narrative  ? Not on file  ? ?Social Determinants of Health  ? ?Financial Resource Strain: Not on file  ?Food Insecurity: Not on file  ?Transportation Needs: Not on file  ?Physical Activity: Not on file  ?Stress: Not on file  ?Social Connections: Not on file  ?  ? ?Family History: ?The patient's family history includes COPD in his mother; Diabetes in his brother; Healthy in his father; Heart attack in his brother; Kidney disease in his mother. ? ?ROS:   ?Please see the history of present illness.    ?His son is getting a PhD in Conservation officer, nature at [employer].  He admits that he is more sedentary than he should be.  Walks 1.5 miles twice per week.  All other systems reviewed and are negative. ? ?EKGs/Labs/Other Studies Reviewed:   ? ?The following studies were reviewed today: ?2D Doppler echocardiogram 2019: ?Study Conclusions  ? ?- Left ventricle: The cavity size was normal. Systolic function was  ?  normal. The estimated ejection fraction was in the range of 55%  ?  to 60%. Although no diagnostic regional wall motion abnormality  ?  was identified, this possibility cannot be completely excluded on  ?  the basis of this study. The study is not technically sufficient  ?  to allow evaluation of LV diastolic function.  ? ?Impressions:  ? ?- Technically difficult study: only parasternal and subcostal  ?  images could be obtained. No major wall motion abnormalities are  ?  seen.  ? ? ?EKG:  EKG normal sinus rhythm, PR interval 170 ms, early repolarization.  Normal EKG.  Compared to prior from May 05, 2020, no changes noted. ? ?Recent Labs: ?No results found for requested labs within last 8760 hours.  ?Recent Lipid Panel ?   ?Component Value Date/Time  ? CHOL 112 11/11/2018 0021  ? TRIG 266 (H) 11/11/2018 0021  ? HDL 29 (L) 11/11/2018 0021  ? CHOLHDL 3.9 11/11/2018 0021  ? VLDL 53 (H) 11/11/2018 0021  ? [place] 30 11/11/2018 0021  ? ? ?Physical Exam:   ? ?VS:  BP 130/78   Pulse 64   Ht '6\' 3"'$  (1.905 m)   Wt 162 lb 9.6 oz (73.8  kg)   SpO2 97%   BMI 20.32 kg/m?    ? ?Wt Readings from Last 3 Encounters:  ?03/13/22 162 lb 9.6 oz (73.8 kg)  ?05/05/20 171 lb 6.4 oz (77.7 kg)  ?01/09/19 177 lb 0.5 oz (80.3 kg)  ?  ? ?GEN: Slender. No acute distress ?HEENT: Normal ?NECK: No JVD. ?LYMPHATICS: No lymphadenopathy ?CARDIAC: No murmur. RRR no gallop, or edema. ?VASCULAR:  Normal Pulses. No bruits. ?RESPIRATORY:  Clear to  auscultation without rales, wheezing or rhonchi  ?ABDOMEN: Soft, non-tender, non-distended, No pulsatile mass, ?MUSCULOSKELETAL: No deformity  ?SKIN: Warm and dry ?NEUROLOGIC:  Alert and oriented x 3 ?PSYCHIATRIC:  Normal affect  ? ?ASSESSMENT:   ? ?1. Coronary artery disease involving native coronary artery of native heart without angina pectoris   ?2. Essential hypertension   ?3. Pure hypercholesterolemia   ?4. Controlled type 2 diabetes mellitus with other circulatory complication, without long-term current use of insulin ([place])   ? ?PLAN:   ? ?In order of problems listed above: ? ?Secondary prevention reviewed.  He is not getting physical activity at the level/quantity needed. ?Target blood pressure 130/80 mmHg.  Low-salt diet and physical activity. ?Target LDL less than 70 is our goal.  Continue Lipitor.  States his laboratory data will be done by primary.  He will send results 2 months. ?Hemoglobin A1c most recently 7.3 in February 2023.  Consider addition of SGLT2 therapy ? ? ? ?Overall education and awareness concerning secondary risk prevention was discussed in detail: LDL less than 70, hemoglobin A1c less than 7, blood pressure target less than 130/80 mmHg, >150 minutes of moderate aerobic activity per week, avoidance of smoking, weight control (via diet and exercise), and continued surveillance/management of/for obstructive sleep apnea. ? ? ? ?Medication Adjustments/Labs and Tests Ordered: ?Current medicines are reviewed at length with the patient today.  Concerns regarding medicines are outlined above.  ?Orders Placed  This Encounter  ?Procedures  ? EKG 12-Lead  ? ?No orders of the defined types were placed in this encounter. ? ? ?There are no Patient Instructions on file for this visit.  ? ?Signed, ?Dennis Grooms, MD  ?4/3

## 2022-03-13 ENCOUNTER — Encounter: Payer: Self-pay | Admitting: Interventional Cardiology

## 2022-03-13 ENCOUNTER — Ambulatory Visit (INDEPENDENT_AMBULATORY_CARE_PROVIDER_SITE_OTHER): Payer: Medicare Other | Admitting: Interventional Cardiology

## 2022-03-13 VITALS — BP 130/78 | HR 64 | Ht 75.0 in | Wt 162.6 lb

## 2022-03-13 DIAGNOSIS — I10 Essential (primary) hypertension: Secondary | ICD-10-CM

## 2022-03-13 DIAGNOSIS — E7800 Pure hypercholesterolemia, unspecified: Secondary | ICD-10-CM | POA: Diagnosis not present

## 2022-03-13 DIAGNOSIS — E1159 Type 2 diabetes mellitus with other circulatory complications: Secondary | ICD-10-CM

## 2022-03-13 DIAGNOSIS — I2510 Atherosclerotic heart disease of native coronary artery without angina pectoris: Secondary | ICD-10-CM

## 2022-03-13 NOTE — Patient Instructions (Signed)

## 2022-04-04 ENCOUNTER — Ambulatory Visit: Payer: Medicare Other | Admitting: Interventional Cardiology

## 2022-04-22 ENCOUNTER — Other Ambulatory Visit: Payer: Self-pay | Admitting: Interventional Cardiology

## 2022-08-16 DIAGNOSIS — I2510 Atherosclerotic heart disease of native coronary artery without angina pectoris: Secondary | ICD-10-CM | POA: Diagnosis not present

## 2022-08-16 DIAGNOSIS — Z1211 Encounter for screening for malignant neoplasm of colon: Secondary | ICD-10-CM | POA: Diagnosis not present

## 2022-08-16 DIAGNOSIS — I10 Essential (primary) hypertension: Secondary | ICD-10-CM | POA: Diagnosis not present

## 2022-08-16 DIAGNOSIS — Z125 Encounter for screening for malignant neoplasm of prostate: Secondary | ICD-10-CM | POA: Diagnosis not present

## 2022-08-16 DIAGNOSIS — E1165 Type 2 diabetes mellitus with hyperglycemia: Secondary | ICD-10-CM | POA: Diagnosis not present

## 2022-08-16 DIAGNOSIS — M720 Palmar fascial fibromatosis [Dupuytren]: Secondary | ICD-10-CM | POA: Diagnosis not present

## 2022-08-16 DIAGNOSIS — L729 Follicular cyst of the skin and subcutaneous tissue, unspecified: Secondary | ICD-10-CM | POA: Diagnosis not present

## 2022-09-09 DIAGNOSIS — Z23 Encounter for immunization: Secondary | ICD-10-CM | POA: Diagnosis not present

## 2022-09-19 ENCOUNTER — Other Ambulatory Visit: Payer: Self-pay | Admitting: Interventional Cardiology

## 2022-09-19 DIAGNOSIS — I2510 Atherosclerotic heart disease of native coronary artery without angina pectoris: Secondary | ICD-10-CM | POA: Diagnosis not present

## 2022-09-19 DIAGNOSIS — M720 Palmar fascial fibromatosis [Dupuytren]: Secondary | ICD-10-CM | POA: Diagnosis not present

## 2022-09-19 DIAGNOSIS — E1165 Type 2 diabetes mellitus with hyperglycemia: Secondary | ICD-10-CM | POA: Diagnosis not present

## 2022-09-19 DIAGNOSIS — E049 Nontoxic goiter, unspecified: Secondary | ICD-10-CM | POA: Diagnosis not present

## 2022-09-19 DIAGNOSIS — E7800 Pure hypercholesterolemia, unspecified: Secondary | ICD-10-CM | POA: Diagnosis not present

## 2022-09-19 DIAGNOSIS — R809 Proteinuria, unspecified: Secondary | ICD-10-CM | POA: Diagnosis not present

## 2022-09-19 DIAGNOSIS — I10 Essential (primary) hypertension: Secondary | ICD-10-CM | POA: Diagnosis not present

## 2022-09-20 DIAGNOSIS — D485 Neoplasm of uncertain behavior of skin: Secondary | ICD-10-CM | POA: Diagnosis not present

## 2022-09-20 DIAGNOSIS — M6740 Ganglion, unspecified site: Secondary | ICD-10-CM | POA: Diagnosis not present

## 2022-09-20 DIAGNOSIS — D2261 Melanocytic nevi of right upper limb, including shoulder: Secondary | ICD-10-CM | POA: Diagnosis not present

## 2022-09-20 DIAGNOSIS — L720 Epidermal cyst: Secondary | ICD-10-CM | POA: Diagnosis not present

## 2022-09-21 DIAGNOSIS — R809 Proteinuria, unspecified: Secondary | ICD-10-CM | POA: Diagnosis not present

## 2022-10-07 DIAGNOSIS — Z23 Encounter for immunization: Secondary | ICD-10-CM | POA: Diagnosis not present

## 2023-01-17 ENCOUNTER — Other Ambulatory Visit: Payer: Self-pay

## 2023-01-17 MED ORDER — LOSARTAN POTASSIUM 50 MG PO TABS: 50.0000 mg | ORAL_TABLET | Freq: Every day | ORAL | 0 refills | Status: DC

## 2023-02-21 DIAGNOSIS — E1165 Type 2 diabetes mellitus with hyperglycemia: Secondary | ICD-10-CM | POA: Diagnosis not present

## 2023-02-21 DIAGNOSIS — I10 Essential (primary) hypertension: Secondary | ICD-10-CM | POA: Diagnosis not present

## 2023-02-21 DIAGNOSIS — R809 Proteinuria, unspecified: Secondary | ICD-10-CM | POA: Diagnosis not present

## 2023-02-21 DIAGNOSIS — E7800 Pure hypercholesterolemia, unspecified: Secondary | ICD-10-CM | POA: Diagnosis not present

## 2023-02-21 DIAGNOSIS — I2510 Atherosclerotic heart disease of native coronary artery without angina pectoris: Secondary | ICD-10-CM | POA: Diagnosis not present

## 2023-02-21 DIAGNOSIS — E049 Nontoxic goiter, unspecified: Secondary | ICD-10-CM | POA: Diagnosis not present

## 2023-05-17 ENCOUNTER — Other Ambulatory Visit: Payer: Self-pay

## 2023-05-17 MED ORDER — ATORVASTATIN CALCIUM 80 MG PO TABS: 80.0000 mg | ORAL_TABLET | Freq: Every day | ORAL | 0 refills | Status: DC

## 2023-05-17 MED ORDER — CLOPIDOGREL BISULFATE 75 MG PO TABS: 75.0000 mg | ORAL_TABLET | Freq: Every day | ORAL | 0 refills | Status: DC

## 2023-05-17 NOTE — Addendum Note (Signed)
Addended by: Margaret Pyle D on: 05/17/2023 10:46 AM   Modules accepted: Orders

## 2023-06-13 ENCOUNTER — Other Ambulatory Visit: Payer: Self-pay | Admitting: Cardiology

## 2023-06-28 ENCOUNTER — Other Ambulatory Visit: Payer: Self-pay | Admitting: Cardiology

## 2023-07-08 ENCOUNTER — Other Ambulatory Visit: Payer: Self-pay | Admitting: Cardiology

## 2023-07-20 DIAGNOSIS — H52223 Regular astigmatism, bilateral: Secondary | ICD-10-CM | POA: Diagnosis not present

## 2023-07-20 DIAGNOSIS — H53143 Visual discomfort, bilateral: Secondary | ICD-10-CM | POA: Diagnosis not present

## 2023-07-20 DIAGNOSIS — H5213 Myopia, bilateral: Secondary | ICD-10-CM | POA: Diagnosis not present

## 2023-07-20 DIAGNOSIS — E119 Type 2 diabetes mellitus without complications: Secondary | ICD-10-CM | POA: Diagnosis not present

## 2023-07-27 ENCOUNTER — Other Ambulatory Visit: Payer: Self-pay

## 2023-07-27 MED ORDER — LOSARTAN POTASSIUM 50 MG PO TABS: 50.0000 mg | ORAL_TABLET | Freq: Every day | ORAL | 0 refills | Status: DC

## 2023-08-02 DIAGNOSIS — E049 Nontoxic goiter, unspecified: Secondary | ICD-10-CM | POA: Diagnosis not present

## 2023-08-02 DIAGNOSIS — I2510 Atherosclerotic heart disease of native coronary artery without angina pectoris: Secondary | ICD-10-CM | POA: Diagnosis not present

## 2023-08-02 DIAGNOSIS — E7800 Pure hypercholesterolemia, unspecified: Secondary | ICD-10-CM | POA: Diagnosis not present

## 2023-08-02 DIAGNOSIS — E1165 Type 2 diabetes mellitus with hyperglycemia: Secondary | ICD-10-CM | POA: Diagnosis not present

## 2023-08-02 DIAGNOSIS — I10 Essential (primary) hypertension: Secondary | ICD-10-CM | POA: Diagnosis not present

## 2023-08-02 DIAGNOSIS — R809 Proteinuria, unspecified: Secondary | ICD-10-CM | POA: Diagnosis not present

## 2023-08-07 ENCOUNTER — Other Ambulatory Visit: Payer: Self-pay | Admitting: Cardiology

## 2023-08-10 ENCOUNTER — Other Ambulatory Visit: Payer: Self-pay | Admitting: Cardiology

## 2023-08-11 ENCOUNTER — Other Ambulatory Visit: Payer: Self-pay | Admitting: Cardiology

## 2023-08-17 DIAGNOSIS — Z23 Encounter for immunization: Secondary | ICD-10-CM | POA: Diagnosis not present

## 2023-08-19 ENCOUNTER — Other Ambulatory Visit: Payer: Self-pay | Admitting: Cardiology

## 2023-11-02 DIAGNOSIS — M545 Low back pain, unspecified: Secondary | ICD-10-CM | POA: Diagnosis not present

## 2023-11-12 DIAGNOSIS — M47896 Other spondylosis, lumbar region: Secondary | ICD-10-CM | POA: Diagnosis not present

## 2023-11-12 DIAGNOSIS — M545 Low back pain, unspecified: Secondary | ICD-10-CM | POA: Diagnosis not present

## 2023-11-20 DIAGNOSIS — M545 Low back pain, unspecified: Secondary | ICD-10-CM | POA: Diagnosis not present

## 2023-11-29 DIAGNOSIS — M47896 Other spondylosis, lumbar region: Secondary | ICD-10-CM | POA: Diagnosis not present

## 2023-11-29 DIAGNOSIS — M47816 Spondylosis without myelopathy or radiculopathy, lumbar region: Secondary | ICD-10-CM | POA: Diagnosis not present

## 2024-02-01 DIAGNOSIS — E7800 Pure hypercholesterolemia, unspecified: Secondary | ICD-10-CM | POA: Diagnosis not present

## 2024-02-01 DIAGNOSIS — R809 Proteinuria, unspecified: Secondary | ICD-10-CM | POA: Diagnosis not present

## 2024-02-01 DIAGNOSIS — E1165 Type 2 diabetes mellitus with hyperglycemia: Secondary | ICD-10-CM | POA: Diagnosis not present

## 2024-02-01 DIAGNOSIS — I2510 Atherosclerotic heart disease of native coronary artery without angina pectoris: Secondary | ICD-10-CM | POA: Diagnosis not present

## 2024-02-08 DIAGNOSIS — E1165 Type 2 diabetes mellitus with hyperglycemia: Secondary | ICD-10-CM | POA: Diagnosis not present

## 2024-02-08 DIAGNOSIS — I2510 Atherosclerotic heart disease of native coronary artery without angina pectoris: Secondary | ICD-10-CM | POA: Diagnosis not present

## 2024-02-08 DIAGNOSIS — I10 Essential (primary) hypertension: Secondary | ICD-10-CM | POA: Diagnosis not present

## 2024-02-08 DIAGNOSIS — R809 Proteinuria, unspecified: Secondary | ICD-10-CM | POA: Diagnosis not present

## 2024-02-08 DIAGNOSIS — E7800 Pure hypercholesterolemia, unspecified: Secondary | ICD-10-CM | POA: Diagnosis not present

## 2024-02-08 DIAGNOSIS — E049 Nontoxic goiter, unspecified: Secondary | ICD-10-CM | POA: Diagnosis not present

## 2024-02-19 ENCOUNTER — Inpatient Hospital Stay (HOSPITAL_COMMUNITY)
Admission: EM | Admit: 2024-02-19 | Discharge: 2024-02-21 | DRG: 190 | Disposition: A | Discharge status: Home / Self Care | Attending: Internal Medicine | Admitting: Internal Medicine

## 2024-02-19 ENCOUNTER — Emergency Department (HOSPITAL_COMMUNITY)

## 2024-02-19 ENCOUNTER — Other Ambulatory Visit: Payer: Self-pay

## 2024-02-19 ENCOUNTER — Encounter (HOSPITAL_COMMUNITY): Payer: Self-pay

## 2024-02-19 DIAGNOSIS — J441 Chronic obstructive pulmonary disease with (acute) exacerbation: Secondary | ICD-10-CM | POA: Diagnosis not present

## 2024-02-19 DIAGNOSIS — Z7902 Long term (current) use of antithrombotics/antiplatelets: Secondary | ICD-10-CM | POA: Diagnosis not present

## 2024-02-19 DIAGNOSIS — Z794 Long term (current) use of insulin: Secondary | ICD-10-CM

## 2024-02-19 DIAGNOSIS — Z833 Family history of diabetes mellitus: Secondary | ICD-10-CM | POA: Diagnosis not present

## 2024-02-19 DIAGNOSIS — Z951 Presence of aortocoronary bypass graft: Secondary | ICD-10-CM

## 2024-02-19 DIAGNOSIS — J449 Chronic obstructive pulmonary disease, unspecified: Secondary | ICD-10-CM

## 2024-02-19 DIAGNOSIS — R0602 Shortness of breath: Principal | ICD-10-CM

## 2024-02-19 DIAGNOSIS — R0600 Dyspnea, unspecified: Secondary | ICD-10-CM | POA: Diagnosis not present

## 2024-02-19 DIAGNOSIS — Z8249 Family history of ischemic heart disease and other diseases of the circulatory system: Secondary | ICD-10-CM | POA: Diagnosis not present

## 2024-02-19 DIAGNOSIS — I252 Old myocardial infarction: Secondary | ICD-10-CM | POA: Diagnosis not present

## 2024-02-19 DIAGNOSIS — E119 Type 2 diabetes mellitus without complications: Secondary | ICD-10-CM

## 2024-02-19 DIAGNOSIS — Z7984 Long term (current) use of oral hypoglycemic drugs: Secondary | ICD-10-CM

## 2024-02-19 DIAGNOSIS — I2510 Atherosclerotic heart disease of native coronary artery without angina pectoris: Secondary | ICD-10-CM

## 2024-02-19 DIAGNOSIS — Z825 Family history of asthma and other chronic lower respiratory diseases: Secondary | ICD-10-CM | POA: Diagnosis not present

## 2024-02-19 DIAGNOSIS — R1319 Other dysphagia: Secondary | ICD-10-CM

## 2024-02-19 DIAGNOSIS — E785 Hyperlipidemia, unspecified: Secondary | ICD-10-CM | POA: Diagnosis present

## 2024-02-19 DIAGNOSIS — R600 Localized edema: Secondary | ICD-10-CM | POA: Diagnosis present

## 2024-02-19 DIAGNOSIS — Z79899 Other long term (current) drug therapy: Secondary | ICD-10-CM

## 2024-02-19 DIAGNOSIS — J9601 Acute respiratory failure with hypoxia: Secondary | ICD-10-CM | POA: Diagnosis present

## 2024-02-19 DIAGNOSIS — F17210 Nicotine dependence, cigarettes, uncomplicated: Secondary | ICD-10-CM | POA: Diagnosis present

## 2024-02-19 DIAGNOSIS — E1165 Type 2 diabetes mellitus with hyperglycemia: Secondary | ICD-10-CM | POA: Diagnosis present

## 2024-02-19 DIAGNOSIS — Z7982 Long term (current) use of aspirin: Secondary | ICD-10-CM | POA: Diagnosis not present

## 2024-02-19 DIAGNOSIS — Z716 Tobacco abuse counseling: Secondary | ICD-10-CM

## 2024-02-19 DIAGNOSIS — I10 Essential (primary) hypertension: Secondary | ICD-10-CM | POA: Diagnosis present

## 2024-02-19 DIAGNOSIS — R1314 Dysphagia, pharyngoesophageal phase: Secondary | ICD-10-CM | POA: Diagnosis present

## 2024-02-19 DIAGNOSIS — R918 Other nonspecific abnormal finding of lung field: Secondary | ICD-10-CM | POA: Diagnosis not present

## 2024-02-19 DIAGNOSIS — Z955 Presence of coronary angioplasty implant and graft: Secondary | ICD-10-CM

## 2024-02-19 HISTORY — DX: Dyspnea, unspecified: R06.00

## 2024-02-19 LAB — BASIC METABOLIC PANEL
Anion gap: 13 (ref 5–15)
BUN: 18 mg/dL (ref 8–23)
CO2: 26 mmol/L (ref 22–32)
Calcium: 9.3 mg/dL (ref 8.9–10.3)
Chloride: 92 mmol/L — ABNORMAL LOW (ref 98–111)
Creatinine, Ser: 0.98 mg/dL (ref 0.61–1.24)
GFR, Estimated: >60 mL/min (ref >60)
Glucose, Bld: 200 mg/dL — ABNORMAL HIGH (ref 70–99)
Potassium: 4.4 mmol/L (ref 3.5–5.1)
Sodium: 131 mmol/L — ABNORMAL LOW (ref 135–145)

## 2024-02-19 LAB — CBC
HCT: 47.5 % (ref 39.0–52.0)
HCT: 48.6 % (ref 39.0–52.0)
Hemoglobin: 16.4 g/dL (ref 13.0–17.0)
Hemoglobin: 16.3 g/dL (ref 13.0–17.0)
MCH: 31.1 pg (ref 26.0–34.0)
MCH: 30.8 pg (ref 26.0–34.0)
MCHC: 34.5 g/dL (ref 30.0–36.0)
MCHC: 33.5 g/dL (ref 30.0–36.0)
MCV: 90.1 fL (ref 80.0–100.0)
MCV: 91.9 fL (ref 80.0–100.0)
Platelets: 196 10*3/uL (ref 150–400)
Platelets: 193 10*3/uL (ref 150–400)
RBC: 5.27 MIL/uL (ref 4.22–5.81)
RBC: 5.29 MIL/uL (ref 4.22–5.81)
RDW: 13 % (ref 11.5–15.5)
RDW: 13 % (ref 11.5–15.5)
WBC: 10.7 10*3/uL — ABNORMAL HIGH (ref 4.0–10.5)
WBC: 11 10*3/uL — ABNORMAL HIGH (ref 4.0–10.5)
nRBC: 0 % (ref 0.0–0.2)
nRBC: 0 % (ref 0.0–0.2)

## 2024-02-19 LAB — TROPONIN I (HIGH SENSITIVITY)
Troponin I (High Sensitivity): 9 ng/L (ref <18)
Troponin I (High Sensitivity): 12 ng/L (ref <18)

## 2024-02-19 LAB — DIFFERENTIAL
Abs Immature Granulocytes: 0.04 10*3/uL (ref 0.00–0.07)
Basophils Absolute: 0 10*3/uL (ref 0.0–0.1)
Basophils Relative: 0 %
Eosinophils Absolute: 0 10*3/uL (ref 0.0–0.5)
Eosinophils Relative: 0 %
Immature Granulocytes: 0 %
Lymphocytes Relative: 10 %
Lymphs Abs: 1.1 10*3/uL (ref 0.7–4.0)
Monocytes Absolute: 1.1 10*3/uL — ABNORMAL HIGH (ref 0.1–1.0)
Monocytes Relative: 10 %
Neutro Abs: 8.6 10*3/uL — ABNORMAL HIGH (ref 1.7–7.7)
Neutrophils Relative %: 80 %

## 2024-02-19 LAB — CBG MONITORING, ED
Glucose-Capillary: 290 mg/dL — ABNORMAL HIGH (ref 70–99)
Glucose-Capillary: 265 mg/dL — ABNORMAL HIGH (ref 70–99)

## 2024-02-19 LAB — HEMOGLOBIN A1C
Hgb A1c MFr Bld: 6.9 % — ABNORMAL HIGH (ref 4.8–5.6)
Mean Plasma Glucose: 151.33 mg/dL

## 2024-02-19 LAB — RESP PANEL BY RT-PCR (RSV, FLU A&B, COVID)  RVPGX2
Influenza A by PCR: NEGATIVE
Influenza B by PCR: NEGATIVE
Resp Syncytial Virus by PCR: NEGATIVE
SARS Coronavirus 2 by RT PCR: NEGATIVE

## 2024-02-19 MED ORDER — METOPROLOL TARTRATE 50 MG PO TABS
50.0000 mg | ORAL_TABLET | Freq: Two times a day (BID) | ORAL | Status: DC
  Administered 2024-02-19 – 2024-02-21 (×4): 50 mg via ORAL
  Filled 2024-02-19: qty 1
  Filled 2024-02-19: qty 2
  Filled 2024-02-19 (×2): qty 1

## 2024-02-19 MED ORDER — METHYLPREDNISOLONE SODIUM SUCC 125 MG IJ SOLR
125.0000 mg | Freq: Once | INTRAMUSCULAR | Status: AC
  Administered 2024-02-19: 125 mg via INTRAVENOUS
  Filled 2024-02-19: qty 2

## 2024-02-19 MED ORDER — LOSARTAN POTASSIUM 50 MG PO TABS
50.0000 mg | ORAL_TABLET | Freq: Once | ORAL | Status: AC
  Administered 2024-02-19: 50 mg via ORAL
  Filled 2024-02-19: qty 1

## 2024-02-19 MED ORDER — REVEFENACIN 175 MCG/3ML IN SOLN
175.0000 ug | Freq: Every day | RESPIRATORY_TRACT | Status: DC
  Administered 2024-02-20: 175 ug via RESPIRATORY_TRACT
  Filled 2024-02-19 (×2): qty 3

## 2024-02-19 MED ORDER — LABETALOL HCL 5 MG/ML IV SOLN
10.0000 mg | Freq: Once | INTRAVENOUS | Status: AC
  Administered 2024-02-19: 10 mg via INTRAVENOUS
  Filled 2024-02-19: qty 4

## 2024-02-19 MED ORDER — ALBUTEROL SULFATE (2.5 MG/3ML) 0.083% IN NEBU
5.0000 mg | INHALATION_SOLUTION | Freq: Once | RESPIRATORY_TRACT | Status: AC
  Administered 2024-02-19: 5 mg via RESPIRATORY_TRACT
  Filled 2024-02-19: qty 6

## 2024-02-19 MED ORDER — ASPIRIN 81 MG PO TBEC
81.0000 mg | DELAYED_RELEASE_TABLET | Freq: Every day | ORAL | Status: DC
  Administered 2024-02-20 – 2024-02-21 (×2): 81 mg via ORAL
  Filled 2024-02-19 (×2): qty 1

## 2024-02-19 MED ORDER — IPRATROPIUM-ALBUTEROL 0.5-2.5 (3) MG/3ML IN SOLN
3.0000 mL | RESPIRATORY_TRACT | Status: AC
  Administered 2024-02-19 (×2): 3 mL via RESPIRATORY_TRACT
  Filled 2024-02-19 (×2): qty 3

## 2024-02-19 MED ORDER — ALBUTEROL SULFATE (2.5 MG/3ML) 0.083% IN NEBU
10.0000 mg/h | INHALATION_SOLUTION | Freq: Once | RESPIRATORY_TRACT | Status: AC
  Administered 2024-02-19: 10 mg/h via RESPIRATORY_TRACT
  Filled 2024-02-19: qty 3

## 2024-02-19 MED ORDER — LABETALOL HCL 5 MG/ML IV SOLN
20.0000 mg | Freq: Once | INTRAVENOUS | Status: AC
  Administered 2024-02-19: 20 mg via INTRAVENOUS

## 2024-02-19 MED ORDER — RIVAROXABAN 10 MG PO TABS
10.0000 mg | ORAL_TABLET | Freq: Every day | ORAL | Status: DC
  Administered 2024-02-19 – 2024-02-21 (×3): 10 mg via ORAL
  Filled 2024-02-19 (×3): qty 1

## 2024-02-19 MED ORDER — IPRATROPIUM-ALBUTEROL 0.5-2.5 (3) MG/3ML IN SOLN
3.0000 mL | Freq: Once | RESPIRATORY_TRACT | Status: AC
  Administered 2024-02-19: 3 mL via RESPIRATORY_TRACT
  Filled 2024-02-19: qty 3

## 2024-02-19 MED ORDER — LOSARTAN POTASSIUM 50 MG PO TABS
50.0000 mg | ORAL_TABLET | Freq: Every day | ORAL | Status: DC
  Administered 2024-02-20 – 2024-02-21 (×2): 50 mg via ORAL
  Filled 2024-02-19 (×2): qty 1

## 2024-02-19 MED ORDER — SODIUM CHLORIDE 0.9 % IV BOLUS
1000.0000 mL | Freq: Once | INTRAVENOUS | Status: AC
  Administered 2024-02-19: 1000 mL via INTRAVENOUS

## 2024-02-19 MED ORDER — ARFORMOTEROL TARTRATE 15 MCG/2ML IN NEBU
15.0000 ug | INHALATION_SOLUTION | Freq: Two times a day (BID) | RESPIRATORY_TRACT | Status: DC
  Administered 2024-02-19 – 2024-02-20 (×2): 15 ug via RESPIRATORY_TRACT
  Filled 2024-02-19 (×2): qty 2

## 2024-02-19 MED ORDER — INSULIN ASPART 100 UNIT/ML IJ SOLN
0.0000 [IU] | Freq: Three times a day (TID) | INTRAMUSCULAR | Status: DC
  Administered 2024-02-19: 8 [IU] via SUBCUTANEOUS
  Administered 2024-02-20: 5 [IU] via SUBCUTANEOUS
  Administered 2024-02-20 – 2024-02-21 (×2): 3 [IU] via SUBCUTANEOUS

## 2024-02-19 MED ORDER — INSULIN GLARGINE 100 UNIT/ML ~~LOC~~ SOLN
20.0000 [IU] | Freq: Every day | SUBCUTANEOUS | Status: DC
  Administered 2024-02-19 – 2024-02-20 (×2): 20 [IU] via SUBCUTANEOUS
  Filled 2024-02-19 (×3): qty 0.2

## 2024-02-19 MED ORDER — ATORVASTATIN CALCIUM 80 MG PO TABS
80.0000 mg | ORAL_TABLET | Freq: Every day | ORAL | Status: DC
  Administered 2024-02-20 – 2024-02-21 (×2): 80 mg via ORAL
  Filled 2024-02-19 (×2): qty 1

## 2024-02-19 MED ORDER — PREDNISONE 20 MG PO TABS
40.0000 mg | ORAL_TABLET | Freq: Every day | ORAL | Status: DC
Start: 2024-02-20 — End: 2024-02-24
  Administered 2024-02-20 – 2024-02-21 (×2): 40 mg via ORAL
  Filled 2024-02-19 (×2): qty 2

## 2024-02-19 NOTE — H&P (Incomplete Revision)
 Date: 02/19/2024         Patient Name:  [name] MRN: [id number]  DOB: [birth year] Age / Sex: 73 y.o., male   PCP: Pcp, No         Medical Service: Internal Medicine Teaching Service         Attending Physician: Dr. Mercie Eon, MD    First Contact: Dr. Kathleen Lime, MD Pager [phone] Pager: [phone]  Second Contact: Dr. Marrianne Mood, MD Pager [phone] Pager: [phone]       After Hours (After 5p/  First Contact Pager: [phone]  weekends / holidays): Second Contact Pager: [phone]   Chief Concern:  "Shortness of breath with productive cough"   History of Present Illness: This is a 73 year old male with a history to MI s/p stent placement x2 and about 30 pack-year smoking history who presented to the ED via self due to concerns for worsening shortness of breath and productive cough.  [name] reported it started with sore throat on Thursday, then he became short of breath. Over last few days it has worsened to the point where he can't sleep, can't eat, can't shower. Decided this morning when he couldn't complete his shower that he needed to come to hospital.He report coughing a "brownish sputum " during this time.    Allergies: No Known Allergies   Past Medical History: Patient Active Problem List   Diagnosis Date Noted   Acute dyspnea 02/19/2024   Presence of stent in coronary artery in patient with coronary artery disease 02/19/2024   Unstable angina (HCC) 11/10/2018   Hyperlipidemia 08/26/2015   Coronary artery disease involving coronary bypass graft of native heart without angina pectoris 07/09/2014   Essential hypertension 07/09/2014   Diabetes (HCC) 07/09/2014   Past Medical History:  Diagnosis Date   Coronary artery disease CARDIOLOGIST- DR Verdis Prime   Diabetes mellitus without complication (HCC) 2010   oral,insulin management   History of acute inferior wall myocardial infarction    03-01-2012  STEMI   History of kidney stones    History  of MI (myocardial infarction) 1998     S/P drug eluting coronary stent placement    Ureteral calculi left    Coronary artery disease involving coronary bypass graft in 2015 Unstable angina 2019 Hyperlipidemia Essential hypertension Diabetes mellitus    Medications: Aspirin 81 mg Lipitor 80 mg Losartan 50 mg Metformin 1000 mg twice daily Lopressor 50 mg  Surgical History: Coronary angioplasty with stent placement in 1988 and 2012 Left heart cath and coronary angiography in 2019  Family History:  Remarkable for COPD and kidney disease in the mother and diabetes and heart attack in his brother.  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  He has had over 30-year pack smoking history but denies the use of alcohol or any other illicit drugs.  He is independent in all of his ADLs and IADLs.  Lives with his long-term girlfriend after his wife died.  Designates his wife as his decision maker and elect to be full code at this time.  Physical Exam:  Blood pressure (!) 228/88, pulse 75, temperature 97.8 F (36.6 C), resp. rate (!) 34, height 6\' 2"  (1.88 m), weight 68.5 kg, SpO2 94%.  Constitutional: well-appearing man, sitting in bed ,NAD HENT: normocephalic atraumatic, mucous membranes moist Cardiovascular: regular rate and rhythm, no m/r/g, no  JVD Pulmonary/Chest: Diminished breath sounds bilaterally.  Mild diffuse wheezing appreciated.On RA.  Abdominal: soft, non-tender, non-distended. No fluid wave, no asterixis Neurological: alert & oriented x 3 MSK: no gross abnormalities. No pitting edema Skin: warm and dry Psych: Normal mood and affect   EKG:  EKG reviewed - Sinus  rhythm  Labs:    Latest Ref Rng & Units 02/19/2024    7:13 AM 11/12/2018    6:03 AM 11/11/2018   12:21 AM  CBC  WBC 4.0 - 10.5 K/uL 10.7  10.6  10.8   Hemoglobin 13.0 - 17.0 g/dL 54.0  98.1  19.1   Hematocrit 39.0 - 52.0 % 47.5  43.0  38.9   Platelets 150 - 400  K/uL 196  177  158        Latest Ref Rng & Units 02/19/2024    7:13 AM 11/25/2018    9:30 AM 11/12/2018    6:03 AM  BMP  Glucose 70 - 99 mg/dL 478  295  621   BUN 8 - 23 mg/dL 18  21  11    Creatinine 0.61 - 1.24 mg/dL 3.08  6.57  8.46   BUN/Creat Ratio 10 - 24  20    Sodium 135 - 145 mmol/L 131  136  139   Potassium 3.5 - 5.1 mmol/L 4.4  4.9  4.1   Chloride 98 - 111 mmol/L 92  101  103   CO2 22 - 32 mmol/L 26  23  25    Calcium 8.9 - 10.3 mg/dL 9.3  9.7  9.4      Images and other studies:  Imaging: DG Chest 2 View Result Date: 02/19/2024 CLINICAL DATA:  Shortness of breath EXAM: CHEST - 2 VIEW COMPARISON:  11/10/2018 FINDINGS: The heart size and mediastinal contours are within normal limits. Hyperinflated lungs. No focal airspace consolidation, pleural effusion, or pneumothorax. Incidentally noted chondroid lesion within the proximal left humeral metaphysis, likely reflecting an enchondroma. IMPRESSION: Hyperinflated lungs, which may reflect COPD. No acute cardiopulmonary findings. Electronically Signed   By: Duanne Guess D.O.   On: 02/19/2024 11:21      Assessment & Plan:  [name] is a 73 y.o. male with a history of chronic tobacco use, multiple MIs with stents placement who presented due to concerns for worsening shortness of breath and productive cough after URI symptoms for the past 3 days and admitted for acute dyspnea.  Principal Problem:   Acute dyspnea Active Problems:   Essential hypertension   Diabetes (HCC)   Presence of stent in coronary artery in patient with coronary artery disease  #Acute Dyspnea  [name]  presented due to concerns for acute dyspnea  after a URI ,that he reports is not new but worse than anything he is had in the past.No recent long travel or immobilization.WELL SCORE of 0. I don't think this is PE.Considering HF,he is not volume overloaded and no s3 on exam, I don't think this is HF either.  Afebrile , WBC within normal limits, I  do not think this is infections. In the setting of his long history of tobacco smoking , acute dyspnea and productive cough with evidence of hyperinflation on CXR, I think this patient probably has an undiagnosed COPD. He is not hypoxic at this time. Will treat presumably for COPD exacerbation and arrange for outpatient PFTs prior to discharge.  - Start Prednisone  40 mg daily for 5 days  - Duonebs - Oxygen therapy as needed  - Starting revefenacin  and arformoterol   #HTN Blood pressures as high as 220  systolic over 90 diastolic.  He is asymptomatic from this.  He does not have pulmonary edema causing dyspnea.  He had a couple doses of labetalol which lowered blood pressure somewhat but did not have any effect on his overall symptomology.  Will restart his BP medicine and may add something at discharge of his blood pressure remains elevated.  He follows with cardiology outside of the hospital, no PCP. - Restart metoprolol 50 mg twice daily starting this p.m. - Restart losartan 50 mg daily tomorrow a.m   #Diabetes  Hgb A1c during admission at goal of 6.9. On home Novolin . He injects 24 units subcutaneously after first meal of the day and then inject 16 units 12 hours later. He also take metformin 1000 mg BID. Patient has an close follow up with an endocrinologist. Will give Semglee 20 units daily and NovoLog 8 units after meals and monitor CBGs.   #CAD s/p Stent Was previously on DAPT but clopidogrel was discontinued per cardiology sometime ago.  Continue aspirin and atorvastatin.   Level of care: Med Surge  Diet: Regular Diet  IVF: N/A  VTE: rivaroxaban (XARELTO) tablet 10 mg Start: 02/19/24 1615 Code:  Surrogate: crews,caroline   Signed: Kathleen Lime, MD 02/19/2024, 5:54 PM

## 2024-02-19 NOTE — H&P (Cosign Needed)
 Date: 02/19/2024         Patient Name:  [name] MRN: [id number]  DOB: [birth year] Age / Sex: 73 y.o., male   PCP: Pcp, No         Medical Service: Internal Medicine Teaching Service         Attending Physician: Dr. Mercie Eon, MD    First Contact: Dr. Kathleen Lime, MD Pager [phone] Pager: [phone]  Second Contact: Dr. Marrianne Mood, MD Pager [phone] Pager: [phone]       After Hours (After 5p/  First Contact Pager: [phone]  weekends / holidays): Second Contact Pager: [phone]   Chief Concern:  "Shortness of breath with productive cough"   History of Present Illness: This is a 73 year old male with a history to MI s/p stent placement x2 and about 30 pack-year smoking history who presented to the ED via self due to concerns for worsening shortness of breath and productive cough.  [name] reported it started with sore throat on Thursday, then he became short of breath. Over last few days it has worsened to the point where he can't sleep, can't eat, can't shower. Decided this morning when he couldn't complete his shower that he needed to come to hospital.He report coughing a "brownish sputum " during this time.    Allergies: No Known Allergies   Past Medical History: Patient Active Problem List   Diagnosis Date Noted   Acute dyspnea 02/19/2024   Presence of stent in coronary artery in patient with coronary artery disease 02/19/2024   Unstable angina (HCC) 11/10/2018   Hyperlipidemia 08/26/2015   Coronary artery disease involving coronary bypass graft of native heart without angina pectoris 07/09/2014   Essential hypertension 07/09/2014   Diabetes (HCC) 07/09/2014   Past Medical History:  Diagnosis Date   Coronary artery disease CARDIOLOGIST- DR Verdis Prime   Diabetes mellitus without complication (HCC) 2010   oral,insulin management   History of acute inferior wall myocardial infarction    03-01-2012  STEMI   History of kidney stones    History  of MI (myocardial infarction) 1998     S/P drug eluting coronary stent placement    Ureteral calculi left    Coronary artery disease involving coronary bypass graft in 2015 Unstable angina 2019 Hyperlipidemia Essential hypertension Diabetes mellitus    Medications: Aspirin 81 mg Lipitor 80 mg Losartan 50 mg Metformin 1000 mg twice daily Lopressor 50 mg  Surgical History: Coronary angioplasty with stent placement in 1988 and 2012 Left heart cath and coronary angiography in 2019  Family History:  Remarkable for COPD and kidney disease in the mother and diabetes and heart attack in his brother.  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  He has had over 30-year pack smoking history but denies the use of alcohol or any other illicit drugs.  He is independent in all of his ADLs and IADLs.  Lives with his long-term girlfriend after his wife died.  Designates his wife as his decision maker and elect to be full code at this time.  Physical Exam:  Blood pressure (!) 228/88, pulse 75, temperature 97.8 F (36.6 C), resp. rate (!) 34, height 6\' 2"  (1.88 m), weight 68.5 kg, SpO2 94%.  Constitutional: well-appearing man, sitting in bed ,NAD HENT: normocephalic atraumatic, mucous membranes moist Cardiovascular: regular rate and rhythm, no m/r/g, no  JVD Pulmonary/Chest: Diminished breath sounds bilaterally.  Mild diffuse wheezing appreciated.On RA.  Abdominal: soft, non-tender, non-distended. No fluid wave, no asterixis Neurological: alert & oriented x 3 MSK: no gross abnormalities. No pitting edema Skin: warm and dry Psych: Normal mood and affect   EKG:  EKG reviewed - Sinus  rhythm  Labs:    Latest Ref Rng & Units 02/19/2024    7:13 AM 11/12/2018    6:03 AM 11/11/2018   12:21 AM  CBC  WBC 4.0 - 10.5 K/uL 10.7  10.6  10.8   Hemoglobin 13.0 - 17.0 g/dL 54.0  98.1  19.1   Hematocrit 39.0 - 52.0 % 47.5  43.0  38.9   Platelets 150 - 400  K/uL 196  177  158        Latest Ref Rng & Units 02/19/2024    7:13 AM 11/25/2018    9:30 AM 11/12/2018    6:03 AM  BMP  Glucose 70 - 99 mg/dL 478  295  621   BUN 8 - 23 mg/dL 18  21  11    Creatinine 0.61 - 1.24 mg/dL 3.08  6.57  8.46   BUN/Creat Ratio 10 - 24  20    Sodium 135 - 145 mmol/L 131  136  139   Potassium 3.5 - 5.1 mmol/L 4.4  4.9  4.1   Chloride 98 - 111 mmol/L 92  101  103   CO2 22 - 32 mmol/L 26  23  25    Calcium 8.9 - 10.3 mg/dL 9.3  9.7  9.4      Images and other studies:  Imaging: DG Chest 2 View Result Date: 02/19/2024 CLINICAL DATA:  Shortness of breath EXAM: CHEST - 2 VIEW COMPARISON:  11/10/2018 FINDINGS: The heart size and mediastinal contours are within normal limits. Hyperinflated lungs. No focal airspace consolidation, pleural effusion, or pneumothorax. Incidentally noted chondroid lesion within the proximal left humeral metaphysis, likely reflecting an enchondroma. IMPRESSION: Hyperinflated lungs, which may reflect COPD. No acute cardiopulmonary findings. Electronically Signed   By: Duanne Guess D.O.   On: 02/19/2024 11:21      Assessment & Plan:  [name] is a 73 y.o. male with a history of chronic tobacco use, multiple MIs with stents placement who presented due to concerns for worsening shortness of breath and productive cough after URI symptoms for the past 3 days and admitted for acute dyspnea.  Principal Problem:   Acute dyspnea Active Problems:   Essential hypertension   Diabetes (HCC)   Presence of stent in coronary artery in patient with coronary artery disease  #Acute Dyspnea  [name]  presented due to concerns for acute dyspnea  after a URI ,that he reports is not new but worse than anything he is had in the past.No recent long travel or immobilization.WELL SCORE of 0. I don't think this is PE.Considering HF,he is not volume overloaded and no s3 on exam, I don't think this is HF either.  Afebrile , WBC within normal limits, I  do not think this is infections. In the setting of his long history of tobacco smoking , acute dyspnea and productive cough with evidence of hyperinflation on CXR, I think this patient probably has an undiagnosed COPD. He is not hypoxic at this time. Will treat presumably for COPD exacerbation and arrange for outpatient PFTs prior to discharge.  - Start Prednisone  40 mg daily for 5 days  - Duonebs - Oxygen therapy as needed  - Starting revefenacin  and arformoterol   #HTN Blood pressures as high as 220  systolic over 90 diastolic.  He is asymptomatic from this.  He does not have pulmonary edema causing dyspnea.  He had a couple doses of labetalol which lowered blood pressure somewhat but did not have any effect on his overall symptomology.  Will restart his BP medicine and may add something at discharge of his blood pressure remains elevated.  He follows with cardiology outside of the hospital, no PCP. - Restart metoprolol 50 mg twice daily starting this p.m. - Restart losartan 50 mg daily tomorrow a.m   #Diabetes  Hgb A1c during admission at goal of 6.9. On home Novolin . He injects 24 units subcutaneously after first meal of the day and then inject 16 units 12 hours later. He also take metformin 1000 mg BID. Patient has an close follow up with an endocrinologist. Will give Semglee 20 units daily and NovoLog 8 units after meals and monitor CBGs.   #CAD s/p Stent Was previously on DAPT but clopidogrel was discontinued per cardiology sometime ago.  Continue aspirin and atorvastatin.   Level of care: Med Surge  Diet: Regular Diet  IVF: N/A  VTE: rivaroxaban (XARELTO) tablet 10 mg Start: 02/19/24 1615 Code:  Surrogate: crews,caroline   Signed: Kathleen Lime, MD 02/19/2024, 5:54 PM

## 2024-02-19 NOTE — ED Triage Notes (Signed)
 Pt came in via POV from home d/t SOB the last 3 days where he states its difficult to catch his breath & feels like he is drowning. Denies any CP, endorses he does have HTN & takes meds for it, not on any diuretics, A/Ox4, no pain during triage.

## 2024-02-19 NOTE — Hospital Course (Addendum)
  Dyspnea [name] came in with shortness of breath on exertion and chest xray showed hyperinflation lungs.He was however afebrile and had no signs of infection.He has no formal PFTs done. Has a long standing history of smoking and continues to smoke. On arrival he was sating well and wasn't requiring oxygen. He was given breathing treatments with Duonebs and initiated on a prednisone. He reported not tolerating Revefenacin and arformoterol via neb so they were discontinued. On hospital day 2, he became very  dyspneic with slight exertion. He was then started on azithromycin and switched his LMA- LABA to [employer] which he tolerated  well. Ambulatory o2 sat improved to 93% without supplemental oxygen. [name] got better and was no longer requiring oxygen. Plans to discharge home to continue course of prednisone, azithromycin discussed and he agreed. Patient was advised to follow up with a PCP to arrange for a PFT.   Dysphagia Reported trouble swallowing for the past 5 years . Describes it as as food getting stuck in his throat.GI was consulted and they saw patient. Given that this is not acute and his current breathing issues, patient was scheduled to follow up outpatient with [facility] for further evaluation  Severe hypertension Blood pressures as high as 220 systolic over 90 diastolic but he remained    asymptomatic from this.There was no no evidence of  pulmonary edema on imaging.He had a couple doses of labetalol which lowered blood pressure somewhat but did not have any effect on his overall symptomology.  His home antihypertensives were restarted but his BP remained fairly unchanged. Hydrochlorothiazide 12.5 and Amlodipine 5 mg daily were added prior to discharge. Plan is to follow up with a PCP to for further BP management.  Diabetes Hgb A1c at goal during admission . Was started Semglee 20 units daily and NovoLog 8 units after meals. We continued monitoring his blood sugar during this  hospitalization which was reassuring.Plan is to encourage follow up with his endocrinologist.  History of coronary artery disease He was continued on his home  aspirin and atorvastatin.

## 2024-02-19 NOTE — ED Provider Notes (Signed)
 [place] EMERGENCY DEPARTMENT AT [facility] Provider Note   CSN: [id number] Arrival date & time: 02/19/24  4098     History  Chief Complaint  Patient presents with   SOB    [name] is a 73 y.o. male.  HPI Patient presents with dyspnea.  He is accompanied by his wife.  He is a longtime smoker, has no formal diagnosis of pulmonary disease.  Over the past 3 days, possibly longer he has had dyspnea, now to the point of not being able to rest in supine positioning. No fever, no syncope, no real chest pain, no cough.    Home Medications Prior to Admission medications   Medication Sig Start Date End Date Taking? Authorizing Provider  aspirin EC 81 MG tablet Take 1 tablet (81 mg total) by mouth daily. 11/12/17   Lyn Records, MD  atorvastatin (LIPITOR) 80 MG tablet TAKE 1 TABLET BY MOUTH EVERY DAY 08/21/23   Lewayne Bunting, MD  calcium carbonate (TUMS EX) 750 MG chewable tablet Chew 2 tablets by mouth as needed for heartburn.    [provider]  clopidogrel (PLAVIX) 75 MG tablet TAKE 1 TABLET BY MOUTH EVERY DAY 08/14/23   Lewayne Bunting, MD  fish oil-omega-3 fatty acids 1000 MG capsule Take 4 g by mouth daily.     [provider]  insulin detemir (LEVEMIR) 100 UNIT/ML injection Inject 18-32 Units into the skin daily. Pt take 18 unit in the morning, and 32 unit at night.    [provider]  losartan (COZAAR) 50 MG tablet TAKE 1 TABLET BY MOUTH EVERY DAY 08/07/23   Antoine Poche, MD  metFORMIN (GLUCOPHAGE) 500 MG tablet Take 1,000 mg by mouth 2 (two) times daily with a meal.     [provider]  metoprolol tartrate (LOPRESSOR) 50 MG tablet TAKE 1 TABLET BY MOUTH TWICE A DAY 04/24/22   Lyn Records, MD  Multiple Vitamin (MULTIVITAMIN) tablet Take 1 tablet by mouth daily.    [provider]  nitroGLYCERIN (NITROSTAT) 0.4 MG SL tablet Place 1 tablet (0.4 mg total) under the tongue every 5 (five) minutes x 3 doses as  needed for chest pain. 11/12/18   Filbert Schilder, NP      Allergies    Patient has no known allergies.    Review of Systems   Review of Systems  Physical Exam Updated Vital Signs BP (!) 181/116   Pulse 85   Temp 97.8 F (36.6 C)   Resp (!) 31   Ht 6\' 2"  (1.88 m)   Wt 68.5 kg   SpO2 96%   BMI 19.39 kg/m  Physical Exam Vitals and nursing note reviewed.  Constitutional:      General: He is not in acute distress.    Appearance: He is well-developed.  HENT:     Head: Normocephalic and atraumatic.  Eyes:     Conjunctiva/sclera: Conjunctivae normal.  Cardiovascular:     Rate and Rhythm: Regular rhythm. Tachycardia present.  Pulmonary:     Effort: Tachypnea and accessory muscle usage present.     Breath sounds: No stridor. Wheezing present.  Abdominal:     General: There is no distension.  Skin:    General: Skin is warm and dry.  Neurological:     Mental Status: He is alert and oriented to person, place, and time.     ED Results / Procedures / Treatments   Labs (all labs ordered are listed,  but only abnormal results are displayed) Labs Reviewed  BASIC METABOLIC PANEL - Abnormal; Notable for the following components:      Result Value   Sodium 131 (*)    Chloride 92 (*)    Glucose, Bld 200 (*)    All other components within normal limits  CBC - Abnormal; Notable for the following components:   WBC 10.7 (*)    All other components within normal limits  RESP PANEL BY RT-PCR (RSV, FLU A&B, COVID)  RVPGX2  TROPONIN I (HIGH SENSITIVITY)  TROPONIN I (HIGH SENSITIVITY)    EKG EKG Interpretation Date/Time:  Tuesday February 19 2024 07:15:07 EDT Ventricular Rate:  94 PR Interval:  132 QRS Duration:  86 QT Interval:  352 QTC Calculation: 440 R Axis:   79  Text Interpretation: Sinus rhythm with occasional Premature ventricular complexes Right atrial enlargement Possible Anterior infarct , age undetermined Abnormal ECG When compared with ECG of 12-Nov-2018 06:24,  PREVIOUS ECG IS PRESENT Confirmed by Gerhard Munch [phone]) on 02/19/2024 10:25:56 AM  Radiology DG Chest 2 View Result Date: 02/19/2024 CLINICAL DATA:  Shortness of breath EXAM: CHEST - 2 VIEW COMPARISON:  11/10/2018 FINDINGS: The heart size and mediastinal contours are within normal limits. Hyperinflated lungs. No focal airspace consolidation, pleural effusion, or pneumothorax. Incidentally noted chondroid lesion within the proximal left humeral metaphysis, likely reflecting an enchondroma. IMPRESSION: Hyperinflated lungs, which may reflect COPD. No acute cardiopulmonary findings. Electronically Signed   By: Duanne Guess D.O.   On: 02/19/2024 11:21    Procedures Procedures    Medications Ordered in ED Medications  ipratropium-albuterol (DUONEB) 0.5-2.5 (3) MG/3ML nebulizer solution 3 mL (has no administration in time range)  sodium chloride 0.9 % bolus 1,000 mL ( Intravenous Stopped 02/19/24 1148)  methylPREDNISolone sodium succinate (SOLU-MEDROL) 125 mg/2 mL injection 125 mg (125 mg Intravenous Given 02/19/24 1048)  albuterol (PROVENTIL) (2.5 MG/3ML) 0.083% nebulizer solution 5 mg (5 mg Nebulization Given 02/19/24 1048)    ED Course/ Medical Decision Making/ A&P                                 Medical Decision Making Adult male with long-term smoking presents with shortness of breath.  Broad differential including undiagnosed COPD exacerbation, heart failure, ACS, pneumonia, viral syndrome. Cardiac 85 sinus normal and pulse ox 96% room air normal However, the patient has tachypnea, respiratory rate in the 30s.  Amount and/or Complexity of Data Reviewed Independent Historian: spouse External Data Reviewed: notes. Labs: ordered. Decision-making details documented in ED Course. Radiology: ordered and independent interpretation performed. Decision-making details documented in ED Course. ECG/medicine tests: ordered and independent interpretation performed. Decision-making details  documented in ED Course.  Risk Prescription drug management. Decision regarding hospitalization.  Repeat exam patient has increased work of breathing, slightly decreased wheezing.  I have reviewed the x-ray at bedside, demonstrating findings concerning for COPD, though again the patient has no formal diagnosis of this.  Labs reviewed, viral panel reviewed, all unremarkable. 3:02 PM Following albuterol, DuoNeb, and continuous albuterol as well as steroids patient continues to have tachypnea, wheezing left greater than right.  Trial ambulation was unsuccessful with minimal amount of distance covered before the patient was dyspneic beyond capacity.  Patient had no chest pain throughout, and with normal troponin, nonischemic EKG, suspicion for pulmonology primary etiology for his dyspnea. Patient will require admission for further monitoring, management, evaluation of possible COPD.  Final Clinical Impression(s) / ED Diagnoses Final diagnoses:  SOB (shortness of breath)     Gerhard Munch, MD 02/19/24 [phone]

## 2024-02-19 NOTE — ED Notes (Signed)
 Pt ambulated 38 feet, while ambulation work of breathing increased and was labored. No SPO2 decrease noted.

## 2024-02-20 ENCOUNTER — Telehealth (HOSPITAL_COMMUNITY): Payer: Self-pay | Admitting: Pharmacy Technician

## 2024-02-20 ENCOUNTER — Other Ambulatory Visit (HOSPITAL_COMMUNITY): Payer: Self-pay

## 2024-02-20 ENCOUNTER — Encounter (HOSPITAL_COMMUNITY): Payer: Self-pay | Admitting: Pharmacy Technician

## 2024-02-20 DIAGNOSIS — Z794 Long term (current) use of insulin: Secondary | ICD-10-CM | POA: Diagnosis not present

## 2024-02-20 DIAGNOSIS — Z716 Tobacco abuse counseling: Secondary | ICD-10-CM | POA: Diagnosis not present

## 2024-02-20 DIAGNOSIS — I252 Old myocardial infarction: Secondary | ICD-10-CM | POA: Diagnosis not present

## 2024-02-20 DIAGNOSIS — F17210 Nicotine dependence, cigarettes, uncomplicated: Secondary | ICD-10-CM | POA: Diagnosis present

## 2024-02-20 DIAGNOSIS — Z7902 Long term (current) use of antithrombotics/antiplatelets: Secondary | ICD-10-CM | POA: Diagnosis not present

## 2024-02-20 DIAGNOSIS — R1319 Other dysphagia: Secondary | ICD-10-CM | POA: Diagnosis not present

## 2024-02-20 DIAGNOSIS — J9601 Acute respiratory failure with hypoxia: Secondary | ICD-10-CM | POA: Diagnosis present

## 2024-02-20 DIAGNOSIS — R0600 Dyspnea, unspecified: Secondary | ICD-10-CM | POA: Diagnosis not present

## 2024-02-20 DIAGNOSIS — Z951 Presence of aortocoronary bypass graft: Secondary | ICD-10-CM | POA: Diagnosis not present

## 2024-02-20 DIAGNOSIS — Z825 Family history of asthma and other chronic lower respiratory diseases: Secondary | ICD-10-CM | POA: Diagnosis not present

## 2024-02-20 DIAGNOSIS — Z7982 Long term (current) use of aspirin: Secondary | ICD-10-CM | POA: Diagnosis not present

## 2024-02-20 DIAGNOSIS — E785 Hyperlipidemia, unspecified: Secondary | ICD-10-CM | POA: Diagnosis present

## 2024-02-20 DIAGNOSIS — J449 Chronic obstructive pulmonary disease, unspecified: Secondary | ICD-10-CM

## 2024-02-20 DIAGNOSIS — E1165 Type 2 diabetes mellitus with hyperglycemia: Secondary | ICD-10-CM | POA: Diagnosis present

## 2024-02-20 DIAGNOSIS — R0602 Shortness of breath: Secondary | ICD-10-CM | POA: Diagnosis present

## 2024-02-20 DIAGNOSIS — I2510 Atherosclerotic heart disease of native coronary artery without angina pectoris: Secondary | ICD-10-CM | POA: Diagnosis present

## 2024-02-20 DIAGNOSIS — Z833 Family history of diabetes mellitus: Secondary | ICD-10-CM | POA: Diagnosis not present

## 2024-02-20 DIAGNOSIS — I10 Essential (primary) hypertension: Secondary | ICD-10-CM | POA: Diagnosis present

## 2024-02-20 DIAGNOSIS — Z955 Presence of coronary angioplasty implant and graft: Secondary | ICD-10-CM | POA: Diagnosis not present

## 2024-02-20 DIAGNOSIS — J441 Chronic obstructive pulmonary disease with (acute) exacerbation: Secondary | ICD-10-CM | POA: Diagnosis present

## 2024-02-20 DIAGNOSIS — R1314 Dysphagia, pharyngoesophageal phase: Secondary | ICD-10-CM | POA: Diagnosis present

## 2024-02-20 DIAGNOSIS — R600 Localized edema: Secondary | ICD-10-CM | POA: Diagnosis present

## 2024-02-20 DIAGNOSIS — Z8249 Family history of ischemic heart disease and other diseases of the circulatory system: Secondary | ICD-10-CM | POA: Diagnosis not present

## 2024-02-20 DIAGNOSIS — Z7984 Long term (current) use of oral hypoglycemic drugs: Secondary | ICD-10-CM | POA: Diagnosis not present

## 2024-02-20 DIAGNOSIS — Z79899 Other long term (current) drug therapy: Secondary | ICD-10-CM | POA: Diagnosis not present

## 2024-02-20 LAB — BASIC METABOLIC PANEL
Anion gap: 11 (ref 5–15)
BUN: 21 mg/dL (ref 8–23)
CO2: 29 mmol/L (ref 22–32)
Calcium: 9.2 mg/dL (ref 8.9–10.3)
Chloride: 91 mmol/L — ABNORMAL LOW (ref 98–111)
Creatinine, Ser: 0.95 mg/dL (ref 0.61–1.24)
GFR, Estimated: >60 mL/min (ref >60)
Glucose, Bld: 269 mg/dL — ABNORMAL HIGH (ref 70–99)
Potassium: 4.6 mmol/L (ref 3.5–5.1)
Sodium: 131 mmol/L — ABNORMAL LOW (ref 135–145)

## 2024-02-20 LAB — CBC
HCT: 41.9 % (ref 39.0–52.0)
Hemoglobin: 14.4 g/dL (ref 13.0–17.0)
MCH: 31 pg (ref 26.0–34.0)
MCHC: 34.4 g/dL (ref 30.0–36.0)
MCV: 90.1 fL (ref 80.0–100.0)
Platelets: 169 10*3/uL (ref 150–400)
RBC: 4.65 MIL/uL (ref 4.22–5.81)
RDW: 12.8 % (ref 11.5–15.5)
WBC: 6 10*3/uL (ref 4.0–10.5)
nRBC: 0 % (ref 0.0–0.2)

## 2024-02-20 LAB — GLUCOSE, CAPILLARY
Glucose-Capillary: 180 mg/dL — ABNORMAL HIGH (ref 70–99)
Glucose-Capillary: 102 mg/dL — ABNORMAL HIGH (ref 70–99)
Glucose-Capillary: 217 mg/dL — ABNORMAL HIGH (ref 70–99)

## 2024-02-20 LAB — HIV ANTIBODY (ROUTINE TESTING W REFLEX): HIV Screen 4th Generation wRfx: NONREACTIVE

## 2024-02-20 MED ORDER — HYDROCHLOROTHIAZIDE 12.5 MG PO TABS
12.5000 mg | ORAL_TABLET | Freq: Every day | ORAL | Status: DC
  Filled 2024-02-20: qty 1

## 2024-02-20 MED ORDER — HYDROCHLOROTHIAZIDE 12.5 MG PO TABS
12.5000 mg | ORAL_TABLET | Freq: Every day | ORAL | Status: DC
  Administered 2024-02-20 – 2024-02-21 (×2): 12.5 mg via ORAL
  Filled 2024-02-20 (×2): qty 1

## 2024-02-20 MED ORDER — IPRATROPIUM-ALBUTEROL 0.5-2.5 (3) MG/3ML IN SOLN
3.0000 mL | RESPIRATORY_TRACT | Status: DC
  Administered 2024-02-20 – 2024-02-21 (×5): 3 mL via RESPIRATORY_TRACT
  Filled 2024-02-20 (×5): qty 3

## 2024-02-20 NOTE — Progress Notes (Addendum)
 HD#0 SUBJECTIVE:  Patient Summary: This is a 73 year old male with a history to MI s/p stent placement x2 and a 30 pack-year smoking history who presented to the ED via POV due to concerns for worsening shortness of breath and productive cough for the past five days and admitted for acute dyspnea concerning for COPD  Overnight Events: No acute event overnight  Interim History: Patient was seen at bedside with his significant other in the room.  Reports he is still short of breath but did say the breathing treatment has helped some.  Reports he could not tolerate revefenacin and arformoterol.  OBJECTIVE:  Vital Signs: Vitals:   02/20/24 0827 02/20/24 0854 02/20/24 1200 02/20/24 1236  BP: (!) 172/76     Pulse: 82     Resp: 18     Temp: 98.4 F (36.9 C)     TempSrc:      SpO2: 99% 99% 91% 92%  Weight:      Height:       Supplemental O2: Nasal Cannula SpO2: 92 % O2 Flow Rate (L/min): 2 L/min  Filed Weights   02/19/24 0710 02/19/24 2314  Weight: 68.5 kg 66.8 kg     Intake/Output Summary (Last 24 hours) at 02/20/2024 1637 Last data filed at 02/20/2024 0900 Gross per 24 hour  Intake 240 ml  Output 0 ml  Net 240 ml   Net IO Since Admission: 1,240.11 mL [02/20/24 1637]  Physical Exam: Physical Exam Constitutional:      General: He is in acute distress.     Appearance: Normal appearance. He is ill-appearing. He is not toxic-appearing or diaphoretic.  HENT:     Head: Normocephalic and atraumatic.  Cardiovascular:     Rate and Rhythm: Normal rate and regular rhythm.  Pulmonary:     Breath sounds: Wheezing present.     Comments: In mild acute distress.  On 2 L nasal cannula satting at 92% Skin:    General: Skin is warm and dry.  Neurological:     General: No focal deficit present.     Mental Status: He is alert and oriented to person, place, and time.     Patient Lines/Drains/Airways Status     Active Line/Drains/Airways     Name Placement date Placement time  Site Days   Peripheral IV 02/19/24 18 G Left Antecubital 02/19/24  1015  Antecubital  1             ASSESSMENT/PLAN:  Assessment: Principal Problem:   Acute dyspnea Active Problems:   Essential hypertension   Diabetes (HCC)   Presence of stent in coronary artery in patient with coronary artery disease   COPD exacerbation (HCC)   Plan: # Acute dyspnea #Concerns for COPD exacerbation Patient continued to be short of breath with exertion.  Reports he gets short of breath when he attempts to go to the bathroom from his hospital bed which is concerning despite breathing treatment.  He could not work with nursing for an ambulatory O2 sat due to worsening dyspnea today and I  anticipate this will be a barrier to his discharge.  Will continue  DuoNebs and oxygen therapy as needed.  Patient will still require an official PFT test done after discharge.  Will plan to discharge home with LAMA as a rescue inhaler when patient becomes more stable with ambulation. If his status worsens, consider giving azithromycin. -Continue prednisone 40 mg daily -Oxygen therapy,wean as tolerated -Continue DuoNebs -Pharmacy help greatly appreciated -Discontinue revefenacin  and arformoterol    #Type 2 diabetes -Continue Semglee 20 units daily and NovoLog 8 units after meals -Continue to monitor CBGs  # Hypertension Blood pressure is uncontrolled.  On home metoprolol and losartan.  I think this patient will benefit from a thiazide for a more adequate blood pressure control. -Start hydrochlorothiazide 12.5 mg daily  Best Practice: Diet: Regular diet IVF: N/A VTE: rivaroxaban (XARELTO) tablet 10 mg Start: 02/19/24 1615 Code: Full AB: No Abx Family Contact: Significant other, at bedside. DISPO: Anticipated discharge tomorrow to Home pending New oxygen and and medical work up  .  Signature: Kathleen Lime , MD Internal Medicine Resident, PGY-1 [facility]  Pager:  [phone] 4:37 PM, 02/20/2024   Please contact the on call pager after 5 pm and on weekends at [phone].

## 2024-02-20 NOTE — Telephone Encounter (Addendum)
 Patient Product/process development scientist completed.    The patient is insured through [employer]. Patient has Medicare and is not eligible for a copay card, but may be able to apply for patient assistance or Medicare RX Payment Plan (Patient Must reach out to their plan, if eligible for payment plan), if available.    Ran test claim for Anoro Ellipta and the current 30 day co-pay is $412.85 due to a $590.00 deductible.  Ran test claim for Stiolto Respimat and Not on Formulary   This test claim was processed through [employer]- copay amounts may vary at other pharmacies due to [employer], or as the patient moves through the different stages of their insurance plan.     Roland Earl, CPHT Pharmacy Technician III Certified Patient Advocate [facility] Patient Advocate Team Direct Number: [phone]  Fax: [phone]

## 2024-02-20 NOTE — Progress Notes (Signed)
 Pt became visibly SOB during Brovona and Pulmicort treatments and they were stopped by pt.

## 2024-02-20 NOTE — Telephone Encounter (Signed)
 ERROR

## 2024-02-20 NOTE — Plan of Care (Signed)

## 2024-02-20 NOTE — Progress Notes (Signed)
 NEW ADMISSION NOTE New Admission Note:    Arrival Method: ED stretcher Mental Orientation: AAOx4 Telemetry: NA Assessment: Completed Skin: See flowsheet IV: LAC Pain: 0/10 Tubes: n/a Safety Measures: Safety Fall Prevention Plan has been given, discussed and signed Admission: Completed [facility] Orientation: Patient has been orientated to the room, unit and staff.  Family: spouse at bedside  Orders have been reviewed and implemented. Will continue to monitor the patient. Call light has been placed within reach and bed alarm has been activated.

## 2024-02-20 NOTE — Inpatient Diabetes Management (Signed)
 Inpatient Diabetes Program Recommendations  AACE/ADA: New Consensus Statement on Inpatient Glycemic Control (2015)  Target Ranges:  Prepandial:   less than 140 mg/dL      Peak postprandial:   less than 180 mg/dL (1-2 hours)      Critically ill patients:  140 - 180 mg/dL   Lab Results  Component Value Date   GLUCAP 180 (H) 02/20/2024   HGBA1C 6.9 (H) 02/19/2024    Review of Glycemic Control  Latest Reference Range & Units 02/19/24 17:39 02/19/24 22:51 02/20/24 07:44  Glucose-Capillary 70 - 99 mg/dL 425 (H) 956 (H) 387 (H)  (H): Data is abnormally high Diabetes history: Type 2 DM Outpatient Diabetes medications: Metformin 1000 mg BID, NPH 16-24 BID Current orders for Inpatient glycemic control: Lantus 20 units at bedtime, Novolog 0-15 unit TID Solumedrol 125 mg x1, tapered to Prednisone 40 mg QD  Inpatient Diabetes Program Recommendations:    Consider adding Novolog 3 units TID (Assuming patient consuming >50% of meals) and changing diet to carb modified.   Thanks, Lujean Rave, MSN, RNC-OB Diabetes Coordinator [phone] (8a-5p)

## 2024-02-21 ENCOUNTER — Encounter: Payer: Self-pay | Admitting: Gastroenterology

## 2024-02-21 DIAGNOSIS — R0600 Dyspnea, unspecified: Secondary | ICD-10-CM | POA: Diagnosis not present

## 2024-02-21 DIAGNOSIS — R1319 Other dysphagia: Secondary | ICD-10-CM

## 2024-02-21 DIAGNOSIS — J441 Chronic obstructive pulmonary disease with (acute) exacerbation: Secondary | ICD-10-CM | POA: Diagnosis not present

## 2024-02-21 LAB — BASIC METABOLIC PANEL
Anion gap: 7 (ref 5–15)
BUN: 20 mg/dL (ref 8–23)
CO2: 34 mmol/L — ABNORMAL HIGH (ref 22–32)
Calcium: 9.4 mg/dL (ref 8.9–10.3)
Chloride: 92 mmol/L — ABNORMAL LOW (ref 98–111)
Creatinine, Ser: 0.93 mg/dL (ref 0.61–1.24)
GFR, Estimated: >60 mL/min (ref >60)
Glucose, Bld: 130 mg/dL — ABNORMAL HIGH (ref 70–99)
Potassium: 4.1 mmol/L (ref 3.5–5.1)
Sodium: 133 mmol/L — ABNORMAL LOW (ref 135–145)

## 2024-02-21 LAB — CBC
HCT: 44.9 % (ref 39.0–52.0)
Hemoglobin: 15.4 g/dL (ref 13.0–17.0)
MCH: 30.9 pg (ref 26.0–34.0)
MCHC: 34.3 g/dL (ref 30.0–36.0)
MCV: 90.2 fL (ref 80.0–100.0)
Platelets: 189 10*3/uL (ref 150–400)
RBC: 4.98 MIL/uL (ref 4.22–5.81)
RDW: 13 % (ref 11.5–15.5)
WBC: 15.1 10*3/uL — ABNORMAL HIGH (ref 4.0–10.5)
nRBC: 0 % (ref 0.0–0.2)

## 2024-02-21 LAB — GLUCOSE, CAPILLARY
Glucose-Capillary: 87 mg/dL (ref 70–99)
Glucose-Capillary: 199 mg/dL — ABNORMAL HIGH (ref 70–99)

## 2024-02-21 MED ORDER — IPRATROPIUM-ALBUTEROL 0.5-2.5 (3) MG/3ML IN SOLN: 3.0000 mL | Freq: Four times a day (QID) | RESPIRATORY_TRACT | Status: DC

## 2024-02-21 MED ORDER — GLUCERNA SHAKE PO LIQD: 237.0000 mL | Freq: Three times a day (TID) | ORAL | Status: DC

## 2024-02-21 MED ORDER — AZITHROMYCIN 500 MG PO TABS
500.0000 mg | ORAL_TABLET | Freq: Every day | ORAL | Status: DC
  Administered 2024-02-21: 500 mg via ORAL
  Filled 2024-02-21: qty 1

## 2024-02-21 MED ORDER — UMECLIDINIUM-VILANTEROL 62.5-25 MCG/ACT IN AEPB
1.0000 | INHALATION_SPRAY | Freq: Every day | RESPIRATORY_TRACT | Status: DC
  Administered 2024-02-21: 1 via RESPIRATORY_TRACT
  Filled 2024-02-21: qty 14

## 2024-02-21 MED ORDER — AZITHROMYCIN 500 MG PO TABS: 500.0000 mg | ORAL_TABLET | Freq: Every day | ORAL | 0 refills | Status: DC

## 2024-02-21 MED ORDER — UMECLIDINIUM-VILANTEROL 62.5-25 MCG/ACT IN AEPB: 1.0000 | INHALATION_SPRAY | Freq: Every day | RESPIRATORY_TRACT | 0 refills | Status: DC

## 2024-02-21 MED ORDER — HYDROCHLOROTHIAZIDE 12.5 MG PO TABS: 12.5000 mg | ORAL_TABLET | Freq: Every day | ORAL | 0 refills | Status: AC

## 2024-02-21 MED ORDER — AMLODIPINE BESYLATE 5 MG PO TABS
5.0000 mg | ORAL_TABLET | Freq: Every day | ORAL | Status: DC
  Administered 2024-02-21: 5 mg via ORAL
  Filled 2024-02-21: qty 1

## 2024-02-21 MED ORDER — AMLODIPINE BESYLATE 5 MG PO TABS: 5.0000 mg | ORAL_TABLET | Freq: Every day | ORAL | 0 refills | Status: DC

## 2024-02-21 MED ORDER — PREDNISONE 20 MG PO TABS: 40.0000 mg | ORAL_TABLET | Freq: Every day | ORAL | 0 refills | Status: DC

## 2024-02-21 MED ORDER — ENSURE ENLIVE PO LIQD: 237.0000 mL | Freq: Two times a day (BID) | ORAL | Status: DC

## 2024-02-21 NOTE — Plan of Care (Signed)

## 2024-02-21 NOTE — Progress Notes (Addendum)
 HD#1 SUBJECTIVE:  Patient Summary: This is a 73 year old male with a history to MI s/p stent placement x2 and a 30 pack-year smoking history who presented to the ED via POV due to concerns for worsening shortness of breath and productive cough for the past five days and admitted for acute dyspnea concerning for COPD  Overnight Events: No acute event overnight  Interim History: Mr. Fullilove seen at bedside.  He was laying in bed with his significant other in the room.  He had questions regarding his care plan and were addressed appropriately.  He is no longer on oxygen supplementation and is satting well on room air.  He however still reports short of breath with the slightest exertion. OBJECTIVE:  Vital Signs: Vitals:   02/21/24 0556 02/21/24 0617 02/21/24 0759 02/21/24 0838  BP: (!) 190/84  (!) 194/86 (!) 194/86  Pulse: 65  84 84  Resp: 18     Temp: 98.6 F (37 C)     TempSrc:      SpO2: 91% 92% 91%   Weight:      Height:       Supplemental O2: Nasal Cannula SpO2: 91 % O2 Flow Rate (L/min): 2 L/min  Filed Weights   02/19/24 0710 02/19/24 2314  Weight: 68.5 kg 66.8 kg     Intake/Output Summary (Last 24 hours) at 02/21/2024 1050 Last data filed at 02/21/2024 0830 Gross per 24 hour  Intake 0 ml  Output 0 ml  Net 0 ml   Net IO Since Admission: 1,240.11 mL [02/21/24 1050]  Physical Exam: Physical Exam Constitutional:      General: He is in acute distress.     Appearance: Normal appearance. He is ill-appearing. He is not toxic-appearing or diaphoretic.  HENT:     Head: Normocephalic and atraumatic.  Cardiovascular:     Rate and Rhythm: Normal rate and regular rhythm.  Pulmonary:     Breath sounds: Wheezing present.     Comments: In mild acute distress.  On 2 L nasal cannula satting at 92% Skin:    General: Skin is warm and dry.  Neurological:     General: No focal deficit present.     Mental Status: He is alert and oriented to person, place, and time.     Patient Lines/Drains/Airways Status     Active Line/Drains/Airways     Name Placement date Placement time Site Days   Peripheral IV 02/19/24 18 G Left Antecubital 02/19/24  1015  Antecubital  1             ASSESSMENT/PLAN:  Assessment: Principal Problem:   Acute dyspnea Active Problems:   Essential hypertension   Diabetes (HCC)   Presence of stent in coronary artery in patient with coronary artery disease   COPD exacerbation (HCC)   Plan: # Acute hypoxic respiratory failure #Concerns for COPD exacerbation With his ongoing symptoms I think it is reasonable to start azithromycin on this patient given his continual need for supplemental oxygen with the slightest exertion.  Will start a LABA -LAMA inhaler today and continue steroids, DuoNebs and oxygen therapy as needed.  I anticipate his ongoing dyspnea on exertion might be a barrier to his discharge.  Will get an ambulatory O2 sat today with and/or without oxygen to quantify how much oxygen he requires on ambulation.  This will inform my decision on how much home oxygen he might require at home if needed. -Start Anoro- Ellipta -Start azithromycin 500 mg daily for 3  days -Continue prednisone 40 mg daily -Oxygen therapy,wean as tolerated -Continue DuoNebs -Pharmacy help greatly appreciated  #Dysphagia ( chronic) Mr Beauchaine expressed concerns of trouble swallowing solid food for the past 5 years. Reports he is fine with liquids but feels solid food get stuck down his throat. His history of weight loss during this time period and long history makes me worry and I think GI consult is reasonable to assess for malignancy vs obstruction. Reached out to Dr Tomasa Rand who recommended outpatient follow up given is ongoing COPD exacerbation.    #Type 2 diabetes  -Continue Semglee 20 units daily and NovoLog 8 units after meals -Continue to monitor CBGs  # Hypertension Blood pressure continues to be elevated despite adding a  thiazide.  I think this patient will benefit from an additional therapy.  Would add amlodipine 5 mg daily and reassess his blood pressure.  He however remain asymptomatic at this time -Continue losartan -Continue Lopressor -Continue on hydrochlorothiazide -Start amlodipine 5 mg - F/U on ambulatory o2 sats   Best Practice: Diet: Regular diet IVF: N/A VTE: rivaroxaban (XARELTO) tablet 10 mg Start: 02/19/24 1615 Code: Full AB: No Abx Family Contact: Significant other, at bedside. DISPO: Anticipated discharge tomorrow to Home pending medical work up  and respiratory stability  Signature: Kathleen Lime , MD Internal Medicine Resident, PGY-1 [facility]  Pager: [phone] 10:50 AM, 02/21/2024   Please contact the on call pager after 5 pm and on weekends at [phone].

## 2024-02-21 NOTE — Progress Notes (Signed)
  Progress Note   Date: 02/21/2024  Patient Name: [name]        MRN#: [id number]  Review the patient's clinical findings supports the diagnosis of:   Diabetes mellitus type 2 with hyperglycemia

## 2024-02-21 NOTE — Discharge Instructions (Addendum)
 Dear [name] ,  It was a pleasure taking care of you at [facility]. You were admitted for worsening shortness of breath  and treated for acute respiratory failure that is concerning for COPD. We are discharging you home now that you are doing better. Please follow the following instructions.   1) For your shortness of breath,the first thing I would recommend is smoking cessation.Abrupt smoking cessation could be very challenging for most patients. If you ever feel you need help, please don't hesitate to reach back out to our clinic and we will be more that glad to talk you through available resource that has proven to be very helpful in smoking cessation. We started you on prednisone and azithromycin while we were in the hospital. I am sending you home on 2 more days of  azithromycin and 2 more days of prednisone. Please take 500 mg of azithromycin daily for the next 2 days. Take 40 mg of prednisone  for the next 2 days.  As discussed, you will benefit from [facility] and I am sending you home on one. Please follow up with your primary care physician when you leave the hospital.We are always more than happy to take over your care after your leave the hospital. You may contact [phone] to set up an appointment and we will see you in our clinic here at [facility] .  It is very important that you set up an appointment to get an official PFT test done. This will confirm your suspected COPD and also inform us  severe it is . This information will inform your treatment.   2) For your hypertension, we increased your medications while you were here because your blood pressure was uncontrolled. We added  two  medicines, which is Hydrochlorothiazide 12.5 mg and Amlodipine 5 mg daily. Please continue these medication until you follow up with a primary care physician.   3) For your Diabetes, we are switching you back to your home diabetes regimen. To me understanding you follow up with an outpatient  endocrinologist. I will recommend that you follow up with then after your discharge.   4)  For your trouble with swallowing, The GI doctors saw you and  wants you to follow up with them outpatient . I will include their information below. You have appointment with them on May 1st. [facility] [address] [place] [address] [place],  [place]  [address] [phone]  Take care,  Dr. Kathleen Lime, MD

## 2024-02-21 NOTE — TOC Transition Note (Signed)
 Transition of Care [facility]) - Discharge Note   Patient Details  Name: [name] MRN: [id number] Date of Birth: [birth year]  Transition of Care [facility]) CM/SW Contact:  Tom-Johnson, Hershal Coria, RN Phone Number: 02/21/2024, 4:21 PM   Clinical Narrative:     Patient is scheduled for discharge today.  Readmission Risk Assessment done. Outpatient f/u, hospital f/u and discharge instructions on AVS. No TOC needs or recommendations noted. Wife, Rayfield Citizen to transport at discharge.  No further TOC needs noted.      Final next level of care: Home/Self Care Barriers to Discharge: Barriers Resolved   Patient Goals and CMS Choice Patient states their goals for this hospitalization and ongoing recovery are:: To return home CMS Medicare.gov Compare Post Acute Care list provided to:: Patient Choice offered to / list presented to : NA      Discharge Placement                Patient to be transferred to facility by: Wife Name of family member notified: [facility]    Discharge Plan and Services Additional resources added to the After Visit Summary for                  DME Arranged: N/A DME Agency: NA       HH Arranged: NA HH Agency: NA        Social Drivers of Health (SDOH) Interventions SDOH Screenings   Food Insecurity: No Food Insecurity (02/19/2024)  Housing: Low Risk  (02/19/2024)  Transportation Needs: Unknown (02/19/2024)  Utilities: Not At Risk (02/19/2024)  Depression (PHQ2-9): Low Risk  (01/13/2019)  Social Connections: Moderately Integrated (02/19/2024)  Tobacco Use: Medium Risk (02/19/2024)     Readmission Risk Interventions    02/21/2024    4:20 PM  Readmission Risk Prevention Plan  Post Dischage Appt Complete  Medication Screening Complete  Transportation Screening Complete

## 2024-02-21 NOTE — Plan of Care (Signed)

## 2024-02-21 NOTE — Progress Notes (Signed)
 SATURATION QUALIFICATIONS: (This note is used to comply with regulatory documentation for home oxygen)  Patient Saturations on Room Air at Rest = 94%  Patient Saturations on Room Air while Ambulating = 93%  Patient does not need home oxygenation.

## 2024-02-21 NOTE — Progress Notes (Signed)
  Progress Note   Date: 02/21/2024  Patient Name: [name]        MRN#: [id number]   Clarification of diagnosis:   Acute hypoxic respiratory failure

## 2024-02-22 NOTE — Discharge Summary (Signed)
 Name: [name] MRN: [id number] DOB: 04-Jun-1951 73 y.o. PCP: Pcp, No  Date of Admission: 02/19/2024  7:01 AM Date of Discharge: 02/22/2024 8:02 AM Attending Physician: Dr.  Lafonda Mosses  Discharge Diagnosis: Principal Problem:   Acute dyspnea Active Problems:   Essential hypertension   Diabetes (HCC)   Presence of stent in coronary artery in patient with coronary artery disease   COPD exacerbation (HCC)   Esophageal dysphagia    Discharge Medications: Allergies as of 02/21/2024   No Known Allergies      Medication List     STOP taking these medications    nitroGLYCERIN 0.4 MG SL tablet Commonly known as: NITROSTAT       TAKE these medications    amLODipine 5 MG tablet Commonly known as: NORVASC Take 1 tablet (5 mg total) by mouth daily.   aspirin EC 81 MG tablet Take 1 tablet (81 mg total) by mouth daily. What changed: when to take this   atorvastatin 80 MG tablet Commonly known as: LIPITOR TAKE 1 TABLET BY MOUTH EVERY DAY What changed: when to take this   azithromycin 500 MG tablet Commonly known as: ZITHROMAX Take 1 tablet (500 mg total) by mouth daily.   hydrochlorothiazide 12.5 MG tablet Commonly known as: HYDRODIURIL Take 1 tablet (12.5 mg total) by mouth daily.   losartan 50 MG tablet Commonly known as: COZAAR TAKE 1 TABLET BY MOUTH EVERY DAY What changed: when to take this   metFORMIN 500 MG tablet Commonly known as: GLUCOPHAGE Take 1,000 mg by mouth 2 (two) times daily with a meal.   metoprolol succinate 50 MG 24 hr tablet Commonly known as: TOPROL-XL Take 50 mg by mouth in the morning and at bedtime.   MUCINEX ALLERGY PO Take 1 tablet by mouth as needed.   multivitamin tablet Take 1 tablet by mouth daily.   NovoLIN N FlexPen 100 UNIT/ML FlexPen Generic drug: Insulin NPH (Human) (Isophane) Inject 16-24 Units into the skin in the morning and at bedtime. Inject 24 units subcutaneously after first meal of the day before digesting  then inject 16 units 12 hours later.   predniSONE 20 MG tablet Commonly known as: DELTASONE Take 2 tablets (40 mg total) by mouth daily with breakfast.   umeclidinium-vilanterol 62.5-25 MCG/ACT Aepb Commonly known as: ANORO ELLIPTA Inhale 1 puff into the lungs daily.        Disposition and follow-up:   [name] was discharged from [facility] in Good condition.  At the hospital follow up visit please address:  1.  Follow-up:  For his concerns for COPD, please ensure he gets a PFT done outpatient,Kindly assess his tolerance with his Anoro Ellipta.Assess his dyspnea and adjust medications as needed.  For his dysphagia, please ensure he follows up with [facility] at the location and time stated below .  For his HTN, please recheck his BP and adjust BP meds if needed. He was initiated on Amlodipine. Assess for lower extremity edema and repeat a BMP due to a recent initiation  of thiazide.   2.  Labs / imaging needed at time of follow-up: N/A  3.  Pending labs/ test needing follow-up: PFT   4.  Medication Changes   ADDED  - Hydrochlorothiazide 12.5 mg daily   - Amlodipine 5 mg Daily   - Prednisone 40 mg daily for 2 more days  - Azithromycin 500 mg Daily for 1 more day  - Anora Ellipta 1 puff daily  Follow-up Appointments: [facility] [address] [place] [address] [place],  [place]  [address] [phone]   04/10/2024 @ 9:30   Hospital Course by problem list:  Dyspnea [name] came in with shortness of breath on exertion and chest xray showed hyperinflation lungs.He was however afebrile and had no signs of infection.He has no formal PFTs done. Has a long standing history of smoking and continues to smoke. On arrival he was sating well and wasn't requiring oxygen. He was given breathing treatments with Duonebs and initiated on a prednisone. He reported not tolerating Revefenacin and arformoterol via neb so they were  discontinued. On hospital day 2, he became very  dyspneic with slight exertion. He was then started on azithromycin and switched his LMA- LABA to [employer] which he tolerated  well. Ambulatory o2 sat improved to 93% without supplemental oxygen. [name] got better and was no longer requiring oxygen. Plans to discharge home to continue course of prednisone, azithromycin discussed and he agreed. Patient was advised to follow up with a PCP to arrange for a PFT.   Dysphagia Reported trouble swallowing for the past 5 years . Describes it as as food getting stuck in his throat.GI was consulted and they saw patient. Given that this is not acute and his current breathing issues, patient was scheduled to follow up outpatient with [facility] for further evaluation  Severe hypertension Blood pressures as high as 220 systolic over 90 diastolic but he remained    asymptomatic from this.There was no no evidence of  pulmonary edema on imaging.He had a couple doses of labetalol which lowered blood pressure somewhat but did not have any effect on his overall symptomology.  His home antihypertensives were restarted but his BP remained fairly unchanged. Hydrochlorothiazide 12.5 and Amlodipine 5 mg daily were added prior to discharge. Plan is to follow up with a PCP to for further BP management.  Diabetes Hgb A1c at goal during admission . Was started Semglee 20 units daily and NovoLog 8 units after meals. We continued monitoring his blood sugar during this hospitalization which was reassuring.Plan is to encourage follow up with his endocrinologist.  History of coronary artery disease He was continued on his home  aspirin and atorvastatin.  Discharge Subjective: Patient seen at bedside with wife in the room. He had gotten off his [place] and was sating on RA. His speaking has gotten better and he said he feels "great" Smoking cessation discussed and he promises to stop. Smoking cessation therapies discussed with him and  he agrees to reach back out when he is ready to quit.   Discharge Exam:   Blood pressure (!) 194/86, pulse 84, temperature 98.6 F (37 C), resp. rate 18, height 6\' 2"  (1.88 m), weight 66.8 kg, SpO2 92%.  Constitutional:well-appearing man, sitting in bed, in no acute distress HENT: normocephalic atraumatic, mucous membranes moist Cardiovascular: regular rate and rhythm, no m/r/g, no JVD Pulmonary/Chest: improved breath sounds,diffuse wheezing,. No crackles  Abdominal: soft, non-tender, non-distended. No fluid wave  Neurological: alert & oriented x 3 MSK: no gross abnormalities. No pitting edema Skin: warm and dry Psych: Normal mood and affect  Pertinent Labs, Studies, and Procedures:     Latest Ref Rng & Units 02/21/2024    8:34 AM 02/20/2024   12:14 AM 02/19/2024    7:16 AM  CBC  WBC 4.0 - 10.5 K/uL 15.1  6.0  11.0   Hemoglobin 13.0 - 17.0 g/dL 13.2  44.0  16.3  Hematocrit 39.0 - 52.0 % 44.9  41.9  48.6   Platelets 150 - 400 K/uL 189  169  193        Latest Ref Rng & Units 02/21/2024    8:34 AM 02/20/2024   12:14 AM 02/19/2024    7:13 AM  CMP  Glucose 70 - 99 mg/dL 409  811  914   BUN 8 - 23 mg/dL 20  21  18    Creatinine 0.61 - 1.24 mg/dL 7.82  9.56  2.13   Sodium 135 - 145 mmol/L 133  131  131   Potassium 3.5 - 5.1 mmol/L 4.1  4.6  4.4   Chloride 98 - 111 mmol/L 92  91  92   CO2 22 - 32 mmol/L 34  29  26   Calcium 8.9 - 10.3 mg/dL 9.4  9.2  9.3     DG Chest 2 View Result Date: 02/19/2024 CLINICAL DATA:  Shortness of breath EXAM: CHEST - 2 VIEW COMPARISON:  11/10/2018 FINDINGS: The heart size and mediastinal contours are within normal limits. Hyperinflated lungs. No focal airspace consolidation, pleural effusion, or pneumothorax. Incidentally noted chondroid lesion within the proximal left humeral metaphysis, likely reflecting an enchondroma. IMPRESSION: Hyperinflated lungs, which may reflect COPD. No acute cardiopulmonary findings. Electronically Signed   By: Duanne Guess  D.O.   On: 02/19/2024 11:21     Discharge Instructions: Discharge Instructions     Call MD for:   Complete by: As directed    Call MD for:  difficulty breathing, headache or visual disturbances   Complete by: As directed    Call MD for:  extreme fatigue   Complete by: As directed    Diet - low sodium heart healthy   Complete by: As directed    Discharge instructions   Complete by: As directed    Dear Mr Yoak ,  It was a pleasure taking care of you at [facility]. You were admitted for worsening shortness of breath  and treated for acute respiratory failure that is concerning for COPD. We are discharging you home now that you are doing better. Please follow the following instructions.   1) For your shortness of breath,the first thing I would recommend is smoking cessation.Abrupt smoking cessation could be very challenging for most patients. If you ever feel you need help, please don't hesitate to reach back out to our clinic and we will be more that glad to talk you through available resource that has proven to be very helpful in smoking cessation. We started you on prednisone and azithromycin while we were in the hospital. I am sending you home on 2 more days of  azithromycin and 2 more days of prednisone. Please take 500 mg of azithromycin daily for the next 2 days. Take 40 mg of prednisone  for the next 2 days.  As discussed, you will benefit from [facility] and I am sending you home on one. Please follow up with your primary care physician when you leave the hospital.We are always more than happy to take over your care after your leave the hospital. You may contact [phone] to set up an appointment and we will see you in our clinic here at [facility] .  It is very important that you set up an appointment to get an official PFT test done. This will confirm your suspected COPD and also inform us  severe it is . This information will inform your treatment.   2) For  your  hypertension, we increased your medications while you were here because your blood pressure was uncontrolled. We added  two  medicines, which is Hydrochlorothiazide 12.5 mg and Amlodipine 5 mg daily. Please continue these medication until you follow up with a primary care physician.   3) For your Diabetes, we are switching you back to your home diabetes regimen. To me understanding you follow up with an outpatient endocrinologist. I will recommend that you follow up with then after your discharge.   4)  For your trouble with swallowing, The GI doctors saw you and  wants you to follow up with them outpatient . I will include their information below. You have appointment with them on May 1st. [facility] [address] [place] [address] [place],  [place]  [address] [phone]  Take care,  Dr. Kathleen Lime, MD   Increase activity slowly   Complete by: As directed        Signed: Kathleen Lime, MD [facility] - PGY1 Pager: [phone] 02/22/2024, 8:02 AM    Please contact the on call pager after 5 pm and on weekends at [phone].

## 2024-02-25 ENCOUNTER — Telehealth: Payer: Self-pay | Admitting: *Deleted

## 2024-02-25 ENCOUNTER — Other Ambulatory Visit: Payer: Self-pay | Admitting: Internal Medicine

## 2024-02-25 DIAGNOSIS — J441 Chronic obstructive pulmonary disease with (acute) exacerbation: Secondary | ICD-10-CM

## 2024-02-25 MED ORDER — UMECLIDINIUM-VILANTEROL 62.5-25 MCG/ACT IN AEPB: 1.0000 | INHALATION_SPRAY | Freq: Every day | RESPIRATORY_TRACT | 3 refills | Status: DC

## 2024-02-25 NOTE — Progress Notes (Unsigned)
 I called and talked with patient. He was recently seen on IMTS inpatient service for COPD exacerbation and was started on LAMA/LABA inhaler. He had questions about new inhaler including if he should be using as rescue inhaler as well.  We talked about new inhaler as well as establishing care with [facility] for PCP follow-up. He is open to this.  P: Refill of anora inhaler sent to preferred pharmacy I sent message to front office to schedule hosptial follow-up for him  I encouraged him to bring inhaler to follow-up appointment so we could review technique with him.  Needs GI follow-up for solid esophageal dysphagia which will be addressed at follow-up visit

## 2024-02-25 NOTE — Telephone Encounter (Signed)
 Pt has upcoming appt 03/03/2024 @ 10:15 with Dr. Sloan Leiter.

## 2024-02-25 NOTE — Telephone Encounter (Signed)
 Copied from CRM [id number]. Topic: Clinical - Medication Question >> Feb 25, 2024 10:05 AM Antony Haste wrote: Reason for CRM: The patient states he was recently discharged from [facility], he had an acute visit with Dr.Machen on 03/11 and he has a question regarding his prescription. He was prescribed two inhalers, he states he was given a rescue inhaler and a maintenance inhaler. He states he was advised to use his rescue inhaler for 7 days after his discharge date and then he should use his maintenance inhaler for an additional 7 days. He wants to verify and make sure if these are the proper instructions he was given? Callback #:[phone]

## 2024-02-27 ENCOUNTER — Encounter: Payer: Self-pay | Admitting: Pulmonary Disease

## 2024-02-27 ENCOUNTER — Ambulatory Visit (INDEPENDENT_AMBULATORY_CARE_PROVIDER_SITE_OTHER): Admitting: Pulmonary Disease

## 2024-02-27 VITALS — BP 126/78 | HR 68 | Temp 97.4°F | Ht 73.0 in | Wt 148.0 lb

## 2024-02-27 DIAGNOSIS — F17210 Nicotine dependence, cigarettes, uncomplicated: Secondary | ICD-10-CM

## 2024-02-27 DIAGNOSIS — J441 Chronic obstructive pulmonary disease with (acute) exacerbation: Secondary | ICD-10-CM | POA: Diagnosis not present

## 2024-02-27 DIAGNOSIS — E119 Type 2 diabetes mellitus without complications: Secondary | ICD-10-CM | POA: Diagnosis not present

## 2024-02-27 MED ORDER — ALBUTEROL SULFATE HFA 108 (90 BASE) MCG/ACT IN AERS: 2.0000 | INHALATION_SPRAY | Freq: Four times a day (QID) | RESPIRATORY_TRACT | 6 refills | Status: AC | PRN

## 2024-02-27 NOTE — Progress Notes (Signed)
 [name]    [id number]    1951/07/31  Primary Care Physician:Pcp, No  Referring Physician: No referring provider defined for this encounter.  Chief complaint:   Patient being seen for chronic obstructive pulmonary disease  HPI:  Was recently hospitalized for COPD exacerbation Since discharge he has been feeling relatively well  Was discharged on Anoro Still has a cough, still weak but starting to feel better overall  He does have a history of hypertension, coronary artery disease, myocardial infarction in 1998, had a stent procedure performed in 2012, 2019 He does have a history of anxiety  Stress had been what led him to resuming smoking, had to quit for many years Past history of COVID infection in 2022, took him many months to get over the infection Cares for a son with autism  Smoked about a pack a day previously, recently was smoking about 7 sticks a day  Cough has become dry since he left the hospital still coughing actively with clear phlegm  Tiredness and weakness particularly when walking short distance   Outpatient Encounter Medications as of 02/27/2024  Medication Sig   albuterol (VENTOLIN HFA) 108 (90 Base) MCG/ACT inhaler Inhale 2 puffs into the lungs every 6 (six) hours as needed for wheezing or shortness of breath.   amLODipine (NORVASC) 5 MG tablet Take 1 tablet (5 mg total) by mouth daily.   aspirin EC 81 MG tablet Take 1 tablet (81 mg total) by mouth daily. (Patient taking differently: Take 81 mg by mouth in the morning.)   atorvastatin (LIPITOR) 80 MG tablet TAKE 1 TABLET BY MOUTH EVERY DAY (Patient taking differently: Take 80 mg by mouth at bedtime.)   Fexofenadine HCl (MUCINEX ALLERGY PO) Take 1 tablet by mouth as needed.   hydrochlorothiazide (HYDRODIURIL) 12.5 MG tablet Take 1 tablet (12.5 mg total) by mouth daily.   Insulin NPH, Human,, Isophane, (NOVOLIN N FLEXPEN) 100 UNIT/ML Kiwkpen Inject 16-24 Units into the skin in the morning  and at bedtime. Inject 24 units subcutaneously after first meal of the day before digesting then inject 16 units 12 hours later.   losartan (COZAAR) 50 MG tablet TAKE 1 TABLET BY MOUTH EVERY DAY (Patient taking differently: Take 50 mg by mouth in the morning.)   metFORMIN (GLUCOPHAGE) 500 MG tablet Take 1,000 mg by mouth 2 (two) times daily with a meal.    metoprolol succinate (TOPROL-XL) 50 MG 24 hr tablet Take 50 mg by mouth in the morning and at bedtime.   Multiple Vitamin (MULTIVITAMIN) tablet Take 1 tablet by mouth daily.   umeclidinium-vilanterol (ANORO ELLIPTA) 62.5-25 MCG/ACT AEPB Inhale 1 puff into the lungs daily.   [DISCONTINUED] azithromycin (ZITHROMAX) 500 MG tablet Take 1 tablet (500 mg total) by mouth daily. (Patient not taking: Reported on 02/27/2024)   [DISCONTINUED] predniSONE (DELTASONE) 20 MG tablet Take 2 tablets (40 mg total) by mouth daily with breakfast. (Patient not taking: Reported on 02/27/2024)   No facility-administered encounter medications on file as of 02/27/2024.    Allergies as of 02/27/2024   (No Known Allergies)    Past Medical History:  Diagnosis Date   Coronary artery disease CARDIOLOGIST- DR Verdis Prime   Diabetes mellitus without complication (HCC) 2010   oral,insulin management   History of acute inferior wall myocardial infarction    03-01-2012  STEMI   History of kidney stones    History of MI (myocardial infarction) 1998     S/P drug eluting  coronary stent placement    Ureteral calculi left    Past Surgical History:  Procedure Laterality Date   CORONARY ANGIOPLASTY WITH STENT PLACEMENT  1998  [facility])   LEFT CIRCUMFLEX STENTIING   CORONARY ANGIOPLASTY WITH STENT PLACEMENT  03/03/2011   STENTING OF IN-STENT RESTENOSIS OF LEFT CIRDUMFLEX  (drug-eluting)   CORONARY STENT INTERVENTION N/A 11/11/2018   Procedure: CORONARY STENT INTERVENTION;  Surgeon: Lyn Records, MD;  Location: [facility] INVASIVE CV LAB;  Service: Cardiovascular;   Laterality: N/A;   LEFT HEART CATH AND CORONARY ANGIOGRAPHY N/A 11/11/2018   Procedure: LEFT HEART CATH AND CORONARY ANGIOGRAPHY;  Surgeon: Lyn Records, MD;  Location: [facility] INVASIVE CV LAB;  Service: Cardiovascular;  Laterality: N/A;    Family History  Problem Relation Age of Onset   COPD Mother    Kidney disease Mother    Healthy Father    Diabetes Brother    Heart attack Brother     Social History   Socioeconomic History   Marital status: Married    Spouse name: Not on file   Number of children: Not on file   Years of education: Not on file   Highest education level: Not on file  Occupational History   Not on file  Tobacco Use   Smoking status: Former    Current packs/day: 0.00    Types: Cigarettes    Quit date: 02/12/1989    Years since quitting: 35.0   Smokeless tobacco: Never   Tobacco comments:    Patient started smoking again in 2014 and recently stopped smoking 02/17/2024.  Vaping Use   Vaping status: Never Used  Substance and Sexual Activity   Alcohol use: Yes    Alcohol/week: 0.0 standard drinks of alcohol    Comment: rare   Drug use: No   Sexual activity: Not on file  Other Topics Concern   Not on file  Social History Narrative   Not on file   Social Drivers of Health   Financial Resource Strain: Not on file  Food Insecurity: No Food Insecurity (02/19/2024)   Hunger Vital Sign    Worried About Running Out of Food in the Last Year: Never true    Ran Out of Food in the Last Year: Never true  Transportation Needs: Unknown (02/19/2024)   PRAPARE - Administrator, Civil Service (Medical): No    Lack of Transportation (Non-Medical): Patient unable to answer  Physical Activity: Not on file  Stress: Not on file  Social Connections: Moderately Integrated (02/19/2024)   Social Connection and Isolation Panel [NHANES]    Frequency of Communication with Friends and Family: More than three times a week    Frequency of Social Gatherings with Friends and  Family: Twice a week    Attends Religious Services: More than 4 times per year    Active Member of [employer] or Organizations: Yes    Attends Banker Meetings: More than 4 times per year    Marital Status: Widowed  Intimate Partner Violence: Not At Risk (02/19/2024)   Humiliation, Afraid, Rape, and Kick questionnaire    Fear of Current or Ex-Partner: No    Emotionally Abused: No    Physically Abused: No    Sexually Abused: No    Review of Systems  Respiratory:  Positive for cough and shortness of breath.   All other systems reviewed and are negative.   Vitals:   02/27/24 0909  BP: 126/78  Pulse: 68  Temp: [name])  97.4 F (36.3 C)  SpO2: 95%     Physical Exam Constitutional:      Appearance: Normal appearance.  HENT:     Head: Normocephalic.     Nose: No congestion.     Mouth/Throat:     Mouth: Mucous membranes are moist.  Eyes:     General: No scleral icterus. Cardiovascular:     Rate and Rhythm: Normal rate and regular rhythm.     Heart sounds: No murmur heard.    No friction rub.  Pulmonary:     Effort: No respiratory distress.     Breath sounds: No stridor. No wheezing or rhonchi.  Musculoskeletal:     Cervical back: No rigidity or tenderness.  Neurological:     Mental Status: He is alert.  Psychiatric:        Mood and Affect: Mood normal.    Data Reviewed: Recent hospital records reviewed  Recent chest x-ray with hyperinflation, no acute infiltrate  No previous pulmonary function test   Assessment:  Chronic obstructive pulmonary disease with recent exacerbation -Recent hospitalization  Symptoms slightly improved since recent hospitalization  Ongoing shortness of breath  History of anxiety  Chronic cough  History of coronary artery disease -Stable  Type 2 diabetes  Plan/Recommendations: Encouraged to continue Anoro  Prescription for rescue inhaler albuterol will be provided  Cough expectorants to help with mucus  clearance  Schedule for pulmonary function test to be done on the day of next visit  Schedule for low-dose CT screening, 25-pack-year smoking history, recently quit smoking  Follow-up in about 3 months  Virl Diamond MD [facility] 02/27/2024, 9:54 AM  CC: No ref. provider found

## 2024-02-27 NOTE — Patient Instructions (Addendum)
 Continue Anoro daily  I will send you a prescription for a rescue inhaler to be used up to 4 times a day as we discussed  Graded exercises as tolerated  Low-dose CT scan of the chest  Pulmonary function test can be done on the day of next visit  I will see you in about 3 months

## 2024-03-03 ENCOUNTER — Ambulatory Visit (INDEPENDENT_AMBULATORY_CARE_PROVIDER_SITE_OTHER): Admitting: Internal Medicine

## 2024-03-03 VITALS — BP 117/64 | HR 67 | Temp 97.8°F | Ht 74.0 in | Wt 157.5 lb

## 2024-03-03 DIAGNOSIS — J449 Chronic obstructive pulmonary disease, unspecified: Secondary | ICD-10-CM | POA: Diagnosis not present

## 2024-03-03 DIAGNOSIS — I10 Essential (primary) hypertension: Secondary | ICD-10-CM

## 2024-03-03 DIAGNOSIS — R1319 Other dysphagia: Secondary | ICD-10-CM

## 2024-03-03 DIAGNOSIS — J42 Unspecified chronic bronchitis: Secondary | ICD-10-CM

## 2024-03-03 NOTE — Progress Notes (Unsigned)
 Subjective:  CC: hospital follow-up and establish care  HPI:  [name] is a 73 y.o. male with a past medical history of diabetes, COPD, HLD, HTN, and CAD s/p PCI to left circumflex, tobacco use disorder who presents today to establish care and hospital follow-up. He was admitted from 3/11-3/13 for COPD exacerbation. He was discharged on LAMA/LABA inhaler.  Since getting out of the hospital he has felt much better.   Please see problem based assessment and plan for additional details.  Past Medical History:  Diagnosis Date   Coronary artery disease CARDIOLOGIST- DR Verdis Prime   Diabetes mellitus without complication (HCC) 2010   oral,insulin management   History of acute inferior wall myocardial infarction    03-01-2012  STEMI   History of kidney stones    History of MI (myocardial infarction) 1998     S/P drug eluting coronary stent placement    Ureteral calculi left    MEDICATIONS:  Anoro ellipta Fexofenadine Metformin 1000 mg bid Hydrochlorothiazide 12.5 mg every day Insulin Albuterol Amlodipine 5 mg Metoprolol succinate 50 mg Losartan 50 mg Atorvastatin 80 mg Asa 81 mg  Family History  Problem Relation Age of Onset   COPD Mother    Kidney disease Mother    Healthy Father    Diabetes Brother    Heart attack Brother     Past Surgical History:  Procedure Laterality Date   CORONARY ANGIOPLASTY WITH STENT PLACEMENT  1998  [facility]   LEFT CIRCUMFLEX STENTIING   CORONARY ANGIOPLASTY WITH STENT PLACEMENT  03/03/2011   STENTING OF IN-STENT RESTENOSIS OF LEFT CIRDUMFLEX  (drug-eluting)   CORONARY STENT INTERVENTION N/A 11/11/2018   Procedure: CORONARY STENT INTERVENTION;  Surgeon: Lyn Records, MD;  [facility];  Service: Cardiovascular;  Laterality: N/A;   LEFT HEART CATH AND CORONARY ANGIOGRAPHY N/A 11/11/2018   Procedure: LEFT HEART CATH AND CORONARY ANGIOGRAPHY;  Surgeon: Lyn Records, MD;  Location: [facility];   Service: Cardiovascular;  Laterality: N/A;     Social History   Socioeconomic History   Marital status: Married    Spouse name: Not on file   Number of children: Not on file   Years of education: Not on file   Highest education level: Not on file  Occupational History   Not on file  Tobacco Use   Smoking status: Former    Current packs/day: 0.00    Types: Cigarettes    Quit date: 02/12/1989    Years since quitting: 35.0   Smokeless tobacco: Never   Tobacco comments:    Patient started smoking again in 2014 and recently stopped smoking 02/17/2024.  Vaping Use   Vaping status: Never Used  Substance and Sexual Activity   Alcohol use: Yes    Alcohol/week: 0.0 standard drinks of alcohol    Comment: rare   Drug use: No   Sexual activity: Not on file  Other Topics Concern   Not on file  Social History Narrative   Not on file   Social Drivers of Health   Financial Resource Strain: Not on file  Food Insecurity: No Food Insecurity (02/19/2024)   Hunger Vital Sign    Worried About Running Out of Food in the Last Year: Never true    Ran Out of Food in the Last Year: Never true  Transportation Needs: Unknown (02/19/2024)   PRAPARE - Administrator, Civil Service (Medical): No    Lack of Transportation (  Non-Medical): Patient unable to answer  Physical Activity: Not on file  Stress: Not on file  Social Connections: Moderately Integrated (02/19/2024)   Social Connection and Isolation Panel [NHANES]    Frequency of Communication with Friends and Family: More than three times a week    Frequency of Social Gatherings with Friends and Family: Twice a week    Attends Religious Services: More than 4 times per year    Active Member of [employer] or Organizations: Yes    Attends Banker Meetings: More than 4 times per year    Marital Status: Widowed  Intimate Partner Violence: Not At Risk (02/19/2024)   Humiliation, Afraid, Rape, and Kick questionnaire    Fear of Current  or Ex-Partner: No    Emotionally Abused: No    Physically Abused: No    Sexually Abused: No    Review of Systems: ROS negative except for what is noted on the assessment and plan.  Objective:  There were no vitals filed for this visit.  Physical Exam: Constitutional: well-appearing, in no acute distress Cardiovascular: regular rate and rhythm, no m/r/g Pulmonary/Chest: normal work of breathing on room air, lungs clear to auscultation bilaterally, distant lung sounds in bilateral bases MSK: normal bulk and tone Neurological: alert & oriented x 3, normal gait Skin: warm and dry   Assessment & Plan:  No problem-specific Assessment & Plan notes found for this encounter.    Patient discussed with Dr. Marjorie Smolder Denise Bramblett, D.O. [facility]  PGY-3 Pager: [phone]  Phone: [phone] Date 03/03/2024  Time 9:59 AM

## 2024-03-03 NOTE — Patient Instructions (Signed)
 Thank you, Mr.Trek A Hyer for allowing [place] to provide your care today.   I am glad that you are feeling so much better!  COPD- Please complete lung function testing and follow-up with pulmonology. I am so glad to hear you are feeling better  Blood pressure Your blood pressure looked good today!. I am checking labs to make sure electrolytes look ok. I will call with results.  Difficulty swallowing Be sure to follow-up with [facility] to have this evaluated. [facility] [address] [place] [address] [place],  [place]  [address] [phone]   04/10/2024 @ 9:30  Diabetes Please ask about if there are other medications that would help with the protein in your urine the next time you are at the endocrinologist. I think you would benefit from jardiance or farxiga with the labs I could see recently from their office.  I have ordered the following labs for you:  Lab Orders         BMP8+Anion Gap      Referrals ordered today:   Referral Orders  No referral(s) requested today     I have ordered the following medication/changed the following medications:   Stop the following medications: There are no discontinued medications.   Start the following medications: No orders of the defined types were placed in this encounter.    Follow up: 6 months   We look forward to seeing you next time. Please call our clinic at [phone] if you have any questions or concerns. The best time to call is Monday-Friday from 9am-4pm, but there is someone available 24/7. If after hours or the weekend, call the main hospital number and ask for the Internal Medicine Resident On-Call. If you need medication refills, please notify your pharmacy one week in advance and they will send [place] a request.   Thank you for trusting me with your care. Wishing you the best!   Rudene Christians, DO [facility]

## 2024-03-04 ENCOUNTER — Encounter: Payer: Self-pay | Admitting: Internal Medicine

## 2024-03-04 LAB — BMP8+ANION GAP
Anion Gap: 13 mmol/L (ref 10.0–18.0)
BUN/Creatinine Ratio: 18 (ref 10–24)
BUN: 16 mg/dL (ref 8–27)
CO2: 27 mmol/L (ref 20–29)
Calcium: 9.6 mg/dL (ref 8.6–10.2)
Chloride: 94 mmol/L — ABNORMAL LOW (ref 96–106)
Creatinine, Ser: 0.91 mg/dL (ref 0.76–1.27)
Glucose: 58 mg/dL — ABNORMAL LOW (ref 70–99)
Potassium: 4.8 mmol/L (ref 3.5–5.2)
Sodium: 134 mmol/L (ref 134–144)
eGFR: 91 mL/min/{1.73_m2} (ref >59)

## 2024-03-04 NOTE — Assessment & Plan Note (Signed)
 He established care with Dr. Dirk Dress of pulmonology on 3/19.  He has been using Anoro Ellipta inhaler.  He also has a rescue inhaler with albuterol.  Vidal Schwalbe function test has been ordered but not scheduled yet.  Patient is aware that this needs to be scheduled.  He quit smoking since hospital admission.  He did not use anything but just decided to quit. P: Continue Anora Ellipta (LAMA/LABA) Congratulated patient on smoking cessation

## 2024-03-04 NOTE — Assessment & Plan Note (Signed)
 He continues to have some dysphagia with swallowing solid foods that has been present for the last several months.  He feels that he is adapted to this and is no longer choking when he is trying to eat.  He does have follow-up with GI on May 1.  I encouraged him to call [place] office if his symptoms worsen prior to that. P: Follow-up of information provided in AVS for [facility]

## 2024-03-04 NOTE — Assessment & Plan Note (Addendum)
 Initial blood pressure elevated at 138/71.  Repeat blood pressure at 117/64.  Home medications include hydrochlorothiazide 12.5 mg, amlodipine 5 mg, metoprolol succinate 50 mg, and losartan 50 mg.  Thiazide was added during hospital admission.  He reports tolerating medication well. A: Pressure well-controlled on 4 agents BMP within normal limits hydrochlorothiazide 12.5 mg, amlodipine 5 mg, metoprolol succinate 50 mg, and losartan 50 mg. He currently has to take 8 pills daily.  In the future could consider combining losartan and hydrochlorothiazide to Hyzaar.  Could also consider combining amlodipine with atorvastatin to caduet cut down on pill burden.

## 2024-03-05 ENCOUNTER — Encounter: Payer: Self-pay | Admitting: Internal Medicine

## 2024-03-05 NOTE — Progress Notes (Signed)
 Internal Medicine Clinic Attending  Case discussed with the resident at the time of the visit.  We reviewed the resident's history and exam and pertinent patient test results.  I agree with the assessment, diagnosis, and plan of care documented in the resident's note.

## 2024-03-14 ENCOUNTER — Other Ambulatory Visit: Payer: Self-pay | Admitting: Student

## 2024-03-14 NOTE — Telephone Encounter (Signed)
 Pharmacy requesting a 90 day supply

## 2024-03-26 ENCOUNTER — Other Ambulatory Visit: Payer: Self-pay

## 2024-03-26 ENCOUNTER — Telehealth: Payer: Self-pay | Admitting: Acute Care

## 2024-03-26 DIAGNOSIS — Z122 Encounter for screening for malignant neoplasm of respiratory organs: Secondary | ICD-10-CM

## 2024-03-26 DIAGNOSIS — Z87891 Personal history of nicotine dependence: Secondary | ICD-10-CM

## 2024-03-26 NOTE — Telephone Encounter (Signed)
 Lung Cancer Screening Narrative/Criteria Questionnaire (Cigarette Smokers Only- No Cigars/Pipes/vapes)   [name]   SDMV:04/22/24 at 0930a/NATALIE                                           1951-06-26                         LDCT: 04/23/24 at 1030am / [facility]    73 y.o.   Phone: [phone]  Lung Screening Narrative (confirm age 27-77 yrs Medicare / 50-80 yrs Private pay insurance)   Insurance information:Medicare    Referring Provider:[place]   This screening involves an initial phone call with a team member from our program. It is called a shared decision making visit. The initial meeting is required by insurance and Medicare to make sure you understand the program. This appointment takes about 15-20 minutes to complete. The CT scan will completed at a separate date/time. This scan takes about 5-10 minutes to complete and you may eat and drink before and after the scan.  Criteria questions for Lung Cancer Screening:   Are you a current or former smoker? Former Age began smoking: 73 yo   If you are a former smoker, what year did you quit smoking? March 2025 but quit once for 17 yrs   To calculate your smoking history, I need an accurate estimate of how many packs of cigarettes you smoked per day and for how many years. (Not just the number of PPD you are now smoking)   Years smoking 35 x Packs per day 1 = Pack years 35   (at least 20 pack yrs)   (Make sure they understand that we need to know how much they have smoked in the past, not just the number of PPD they are smoking now)  Do you have a personal history of cancer?  No    Do you have a family history of cancer? No  Are you coughing up blood?  No  Have you had unexplained weight loss of 15 lbs or more in the last 6 months? No  *had recent March hosp admit and lost some wt due to he 'couldn't breathe' but doing much better now  It looks like you meet all criteria.     Additional information: NA

## 2024-04-10 ENCOUNTER — Ambulatory Visit: Admitting: Gastroenterology

## 2024-04-10 NOTE — Progress Notes (Deleted)
 [name]

## 2024-04-22 ENCOUNTER — Ambulatory Visit

## 2024-04-22 ENCOUNTER — Encounter: Payer: Self-pay | Admitting: *Deleted

## 2024-04-22 DIAGNOSIS — Z87891 Personal history of nicotine dependence: Secondary | ICD-10-CM

## 2024-04-22 NOTE — Progress Notes (Signed)
 Virtual Visit via Telephone Note  I connected with [name] on 04/22/24 at  9:30 AM EDT by telephone and verified that I am speaking with the correct person using two identifiers.  Location: Patient: [name] Provider: Deronda Flesher   I discussed the limitations, risks, security and privacy concerns of performing an evaluation and management service by telephone and the availability of in person appointments. I also discussed with the patient that there may be a patient responsible charge related to this service. The patient expressed understanding and agreed to proceed.    Shared Decision Making Visit Lung Cancer Screening Program [id number])   Eligibility: Age 73 y.o. Pack Years Smoking History Calculation 35 (# packs/per year x # years smoked) Recent History of coughing up blood  no Unexplained weight loss? no ( >Than 15 pounds within the last 6 months ) Prior History Lung / other cancer no (Diagnosis within the last 5 years already requiring surveillance chest CT Scans). Smoking Status Former Smoker Former Smokers: Years since quit: < 1 year  Quit Date: 02/2024  Visit Components: Discussion included one or more decision making aids. yes Discussion included risk/benefits of screening. yes Discussion included potential follow up diagnostic testing for abnormal scans. yes Discussion included meaning and risk of over diagnosis. yes Discussion included meaning and risk of False Positives. yes Discussion included meaning of total radiation exposure. yes  Counseling Included: Importance of adherence to annual lung cancer LDCT screening. yes Impact of comorbidities on ability to participate in the program. yes Ability and willingness to under diagnostic treatment. yes  Smoking Cessation Counseling: Current Smokers:  Discussed importance of smoking cessation. yes Information about tobacco cessation classes and interventions provided to patient. yes Patient  provided with "ticket" for LDCT Scan. no Symptomatic Patient. yes  Counseling(Intermediate counseling: > three minutes) 99406 Diagnosis Code: Tobacco Use Z72.0 Asymptomatic Patient yes  Counseling (Intermediate counseling: > three minutes counseling) O1308 Former Smokers:  Discussed the importance of maintaining cigarette abstinence. yes Diagnosis Code: Personal History of Nicotine Dependence. M57.846 Information about tobacco cessation classes and interventions provided to patient. Yes Patient provided with "ticket" for LDCT Scan. no Written Order for Lung Cancer Screening with LDCT placed in Epic. Yes (CT Chest Lung Cancer Screening Low Dose W/O CM) [id number] Z12.2-Screening of respiratory organs Z87.891-Personal history of nicotine dependence   Alyse Bach, RN

## 2024-04-22 NOTE — Patient Instructions (Signed)

## 2024-04-23 ENCOUNTER — Ambulatory Visit (HOSPITAL_BASED_OUTPATIENT_CLINIC_OR_DEPARTMENT_OTHER)
Admission: RE | Admit: 2024-04-23 | Discharge: 2024-04-23 | Disposition: A | Discharge status: Home / Self Care | Source: Ambulatory Visit | Attending: Internal Medicine | Admitting: Internal Medicine

## 2024-04-23 DIAGNOSIS — Z122 Encounter for screening for malignant neoplasm of respiratory organs: Secondary | ICD-10-CM | POA: Diagnosis not present

## 2024-04-23 DIAGNOSIS — F17210 Nicotine dependence, cigarettes, uncomplicated: Secondary | ICD-10-CM | POA: Diagnosis not present

## 2024-04-23 DIAGNOSIS — Z87891 Personal history of nicotine dependence: Secondary | ICD-10-CM | POA: Diagnosis not present

## 2024-05-14 ENCOUNTER — Telehealth: Payer: Self-pay | Admitting: Acute Care

## 2024-05-14 NOTE — Telephone Encounter (Signed)
 Call report received:  IMPRESSION: 1. Lung-RADS 4A, suspicious. 11.0 mm right middle lobe pulmonary nodule.Follow up low-dose chest CT without contrast in 3 months (please use the following order, "CT CHEST LCS NODULE FOLLOW-UP W/O CM") is recommended. Alternatively, PET may be considered when there is a solid component 8mm or larger. 2. Aortic Atherosclerosis (ICD10-I70.0) and Emphysema (ICD10-J43.9).   These results will be called to the ordering clinician or representative by the Radiologist Assistant, and communication documented in the PACS or [employer].     Electronically Signed   By: Donnal Fusi M.D.   On: 05/14/2024 07:46

## 2024-05-14 NOTE — Telephone Encounter (Signed)
 Dara Ear NP has reviewed this scan and due to this being the pt's first LDCT and the size of the nodule, 11.30mm, it has been advised the pt has a 3 month follow up scan to assure stability. Will need to call pt with the results and recs. Send PCP the results and plan.

## 2024-05-15 ENCOUNTER — Other Ambulatory Visit: Payer: Self-pay

## 2024-05-15 DIAGNOSIS — Z122 Encounter for screening for malignant neoplasm of respiratory organs: Secondary | ICD-10-CM

## 2024-05-15 DIAGNOSIS — Z87891 Personal history of nicotine dependence: Secondary | ICD-10-CM

## 2024-05-15 DIAGNOSIS — R911 Solitary pulmonary nodule: Secondary | ICD-10-CM

## 2024-05-15 DIAGNOSIS — F17210 Nicotine dependence, cigarettes, uncomplicated: Secondary | ICD-10-CM

## 2024-05-15 NOTE — Telephone Encounter (Signed)
 I have spoken with the patient and reviewed his recent Lung CT results. Pt will complete a 3 month f/u scan on 07/25/2024 at [facility] @ 8:30am. Pt had no questions. No PCP on file. Results and plan sent to Dr. Gaynell Keeler.

## 2024-06-19 ENCOUNTER — Ambulatory Visit: Admitting: Pulmonary Disease

## 2024-06-19 VITALS — BP 127/72 | HR 63 | Ht 74.0 in | Wt 158.0 lb

## 2024-06-19 DIAGNOSIS — F419 Anxiety disorder, unspecified: Secondary | ICD-10-CM

## 2024-06-19 DIAGNOSIS — E119 Type 2 diabetes mellitus without complications: Secondary | ICD-10-CM | POA: Diagnosis not present

## 2024-06-19 DIAGNOSIS — R911 Solitary pulmonary nodule: Secondary | ICD-10-CM

## 2024-06-19 DIAGNOSIS — J449 Chronic obstructive pulmonary disease, unspecified: Secondary | ICD-10-CM | POA: Diagnosis not present

## 2024-06-19 DIAGNOSIS — I10 Essential (primary) hypertension: Secondary | ICD-10-CM | POA: Diagnosis not present

## 2024-06-19 DIAGNOSIS — R053 Chronic cough: Secondary | ICD-10-CM

## 2024-06-19 DIAGNOSIS — J441 Chronic obstructive pulmonary disease with (acute) exacerbation: Secondary | ICD-10-CM

## 2024-06-19 LAB — PULMONARY FUNCTION TEST
DL/VA % pred: 53 %
DL/VA: 2.1 ml/min/mmHg/L
DLCO cor % pred: 56 %
DLCO cor: 16.46 ml/min/mmHg
DLCO unc % pred: 56 %
DLCO unc: 16.46 ml/min/mmHg
FEF 25-75 Post: 0.79 L/s
FEF 25-75 Pre: 0.64 L/s
FEF2575-%Change-Post: 23 %
FEF2575-%Pred-Post: 28 %
FEF2575-%Pred-Pre: 22 %
FEV1-%Change-Post: 10 %
FEV1-%Pred-Post: 42 %
FEV1-%Pred-Pre: 38 %
FEV1-Post: 1.58 L
FEV1-Pre: 1.43 L
FEV1FVC-%Change-Post: 0 %
FEV1FVC-%Pred-Pre: 57 %
FEV6-%Change-Post: 8 %
FEV6-%Pred-Post: 75 %
FEV6-%Pred-Pre: 69 %
FEV6-Post: 3.64 L
FEV6-Pre: 3.37 L
FEV6FVC-%Change-Post: -1 %
FEV6FVC-%Pred-Post: 102 %
FEV6FVC-%Pred-Pre: 104 %
FVC-%Change-Post: 9 %
FVC-%Pred-Post: 73 %
FVC-%Pred-Pre: 67 %
FVC-Post: 3.74 L
FVC-Pre: 3.41 L
Post FEV1/FVC ratio: 42 %
Post FEV6/FVC ratio: 97 %
Pre FEV1/FVC ratio: 42 %
Pre FEV6/FVC Ratio: 99 %

## 2024-06-19 NOTE — Progress Notes (Signed)
 Pre/post and diffusion capacity performed today.

## 2024-06-19 NOTE — Patient Instructions (Signed)
 Pre/post and diffusion capacity performed today.

## 2024-06-19 NOTE — Patient Instructions (Signed)
 I will see you back in about 6 months  Continue using your Anoro  Continue staying active  Call us  with significant concerns  Somebody will call you following your CT scan about the spot on the lung

## 2024-06-19 NOTE — Progress Notes (Signed)
 [name]    [id number]    1951/03/24  Primary Care Physician:Pcp, No  Referring Physician: No referring provider defined for this encounter.  Chief complaint:   Patient being seen for chronic obstructive pulmonary disease  HPI:  Continues to feel relatively well Had a recent hospitalization for COPD  Currently continues to use Anoro on a regular basis and feeling generally well He does have a chronic cough, not really bringing up any secretions Does not feel acutely ill  History of hypertension, coronary artery disease, myocardial infarction, s/p stenting in 2019 History of anxiety  He had a COVID infection in 2022, it took him many months to get over the infection Only smokes a cigarette here and there, last smoked a cigarette over 2 weeks ago  Was smoking a pack previously  Tiredness and weakness particularly when walking short distance   Outpatient Encounter Medications as of 06/19/2024  Medication Sig   albuterol  (VENTOLIN  HFA) 108 (90 Base) MCG/ACT inhaler Inhale 2 puffs into the lungs every 6 (six) hours as needed for wheezing or shortness of breath.   amLODipine  (NORVASC ) 5 MG tablet TAKE 1 TABLET (5 MG TOTAL) BY MOUTH DAILY.   aspirin  EC 81 MG tablet Take 1 tablet (81 mg total) by mouth daily. (Patient taking differently: Take 81 mg by mouth in the morning.)   atorvastatin  (LIPITOR ) 80 MG tablet TAKE 1 TABLET BY MOUTH EVERY DAY (Patient taking differently: Take 80 mg by mouth at bedtime.)   hydrochlorothiazide  (HYDRODIURIL ) 12.5 MG tablet Take 1 tablet (12.5 mg total) by mouth daily.   Insulin  NPH, Human,, Isophane, (NOVOLIN N FLEXPEN) 100 UNIT/ML Kiwkpen Inject 16-24 Units into the skin in the morning and at bedtime. Inject 24 units subcutaneously after first meal of the day before digesting then inject 16 units 12 hours later.   losartan  (COZAAR ) 50 MG tablet TAKE 1 TABLET BY MOUTH EVERY DAY (Patient taking differently: Take 50 mg by mouth in the  morning.)   metFORMIN (GLUCOPHAGE) 500 MG tablet Take 1,000 mg by mouth 2 (two) times daily with a meal.    metoprolol  succinate (TOPROL -XL) 50 MG 24 hr tablet Take 50 mg by mouth in the morning and at bedtime.   Multiple Vitamin (MULTIVITAMIN) tablet Take 1 tablet by mouth daily.   umeclidinium-vilanterol (ANORO ELLIPTA ) 62.5-25 MCG/ACT AEPB Inhale 1 puff into the lungs daily.   Fexofenadine HCl (MUCINEX ALLERGY PO) Take 1 tablet by mouth as needed. (Patient not taking: Reported on 06/19/2024)   No facility-administered encounter medications on file as of 06/19/2024.    Allergies as of 06/19/2024   (No Known Allergies)    Past Medical History:  Diagnosis Date   Acute dyspnea 02/19/2024   Coronary artery disease CARDIOLOGIST- DR VICTORY SHARPS   Diabetes mellitus without complication (HCC) 12/11/2008   oral,insulin  management   History of acute inferior wall myocardial infarction    03-01-2012  STEMI   History of kidney stones    History of MI (myocardial infarction) 12/11/1996   S/P drug eluting coronary stent placement    Ureteral calculi left    Past Surgical History:  Procedure Laterality Date   CORONARY ANGIOPLASTY WITH STENT PLACEMENT  1998  [facility])   LEFT CIRCUMFLEX STENTIING   CORONARY ANGIOPLASTY WITH STENT PLACEMENT  03/03/2011   STENTING OF IN-STENT RESTENOSIS OF LEFT CIRDUMFLEX  (drug-eluting)   CORONARY STENT INTERVENTION N/A 11/11/2018   Procedure: CORONARY STENT INTERVENTION;  Surgeon: SHARPS VICTORY  W, MD;  [facility];  Service: Cardiovascular;  Laterality: N/A;   LEFT HEART CATH AND CORONARY ANGIOGRAPHY N/A 11/11/2018   Procedure: LEFT HEART CATH AND CORONARY ANGIOGRAPHY;  Surgeon: Claudene Victory ORN, MD;  Location: [facility];  Service: Cardiovascular;  Laterality: N/A;    Family History  Problem Relation Age of Onset   COPD Mother    Kidney disease Mother    Healthy Father    Diabetes Brother    Heart attack Brother     Social  History   Socioeconomic History   Marital status: Married    Spouse name: Not on file   Number of children: Not on file   Years of education: Not on file   Highest education level: Not on file  Occupational History   Not on file  Tobacco Use   Smoking status: Former    Current packs/day: 0.00    Average packs/day: 1 pack/day for 35.2 years (35.2 ttl pk-yrs)    Types: Cigarettes    Start date: 83    Quit date: 02/2024    Years since quitting: 0.3   Smokeless tobacco: Never   Tobacco comments:    Patient started smoking again in 2014 and recently stopped smoking 02/17/2024.  Vaping Use   Vaping status: Never Used  Substance and Sexual Activity   Alcohol use: Yes    Alcohol/week: 0.0 standard drinks of alcohol    Comment: rare   Drug use: No   Sexual activity: Not on file  Other Topics Concern   Not on file  Social History Narrative   Not on file   Social Drivers of Health   Financial Resource Strain: Not on file  Food Insecurity: No Food Insecurity (02/19/2024)   Hunger Vital Sign    Worried About Running Out of Food in the Last Year: Never true    Ran Out of Food in the Last Year: Never true  Transportation Needs: Unknown (02/19/2024)   PRAPARE - Administrator, Civil Service (Medical): No    Lack of Transportation (Non-Medical): Patient unable to answer  Physical Activity: Not on file  Stress: Not on file  Social Connections: Moderately Integrated (02/19/2024)   Social Connection and Isolation Panel    Frequency of Communication with Friends and Family: More than three times a week    Frequency of Social Gatherings with Friends and Family: Twice a week    Attends Religious Services: More than 4 times per year    Active Member of [employer] or Organizations: Yes    Attends Banker Meetings: More than 4 times per year    Marital Status: Widowed  Intimate Partner Violence: Not At Risk (02/19/2024)   Humiliation, Afraid, Rape, and Kick questionnaire     Fear of Current or Ex-Partner: No    Emotionally Abused: No    Physically Abused: No    Sexually Abused: No    Review of Systems  Respiratory:  Positive for cough and shortness of breath.   All other systems reviewed and are negative.   Vitals:   06/19/24 0943  BP: 127/72  Pulse: 63  SpO2: 96%     Physical Exam Constitutional:      Appearance: Normal appearance.  HENT:     Head: Normocephalic.     Nose: No congestion.     Mouth/Throat:     Mouth: Mucous membranes are moist.  Eyes:     General: No scleral icterus. Cardiovascular:  Rate and Rhythm: Normal rate and regular rhythm.     Heart sounds: No murmur heard.    No friction rub.  Pulmonary:     Effort: No respiratory distress.     Breath sounds: No stridor. No wheezing or rhonchi.  Musculoskeletal:     Cervical back: No rigidity or tenderness.  Neurological:     Mental Status: He is alert.  Psychiatric:        Mood and Affect: Mood normal.    Data Reviewed: Recent hospital records reviewed  Pulmonary function test reviewed showing severe obstructive disease with no significant bronchodilator response, FEV1 of 38%, diffusing capacity of 56%  CT scan of the chest reviewed showing a right middle lobe nodule-11 mm, repeat testing is pending   Assessment:  Gold 3 COPD  Compliant with Anoro and benefiting from use  Shortness of breath on exertion  History of anxiety - Better control  Chronic cough  Abnormal CT scan showing 11 mm right lung nodule  Type 2 diabetes   Plan/Recommendations: Continue Anoro  Albuterol  as needed  Follow-up CT in about 3 months  Commended about quitting smoking  Follow-up in about 6 months  He will receive a follow-up call following his CT scan  Jennet Epley MD [facility] 06/19/2024, 9:55 AM  CC: No ref. provider found

## 2024-07-02 ENCOUNTER — Ambulatory Visit: Payer: Self-pay | Admitting: Pulmonary Disease

## 2024-07-25 ENCOUNTER — Ambulatory Visit (HOSPITAL_BASED_OUTPATIENT_CLINIC_OR_DEPARTMENT_OTHER)
Admission: RE | Admit: 2024-07-25 | Discharge: 2024-07-25 | Disposition: A | Discharge status: Home / Self Care | Source: Ambulatory Visit | Attending: Internal Medicine | Admitting: Internal Medicine

## 2024-07-25 DIAGNOSIS — Z122 Encounter for screening for malignant neoplasm of respiratory organs: Secondary | ICD-10-CM | POA: Insufficient documentation

## 2024-07-25 DIAGNOSIS — Z87891 Personal history of nicotine dependence: Secondary | ICD-10-CM | POA: Insufficient documentation

## 2024-07-25 DIAGNOSIS — F17210 Nicotine dependence, cigarettes, uncomplicated: Secondary | ICD-10-CM | POA: Diagnosis not present

## 2024-07-25 DIAGNOSIS — R911 Solitary pulmonary nodule: Secondary | ICD-10-CM | POA: Diagnosis not present

## 2024-07-28 ENCOUNTER — Other Ambulatory Visit: Payer: Self-pay

## 2024-07-28 DIAGNOSIS — J441 Chronic obstructive pulmonary disease with (acute) exacerbation: Secondary | ICD-10-CM

## 2024-07-28 MED ORDER — UMECLIDINIUM-VILANTEROL 62.5-25 MCG/ACT IN AEPB: 1.0000 | INHALATION_SPRAY | Freq: Every day | RESPIRATORY_TRACT | 3 refills | Status: DC

## 2024-08-15 ENCOUNTER — Telehealth: Payer: Self-pay

## 2024-08-15 DIAGNOSIS — Z87891 Personal history of nicotine dependence: Secondary | ICD-10-CM

## 2024-08-15 DIAGNOSIS — R911 Solitary pulmonary nodule: Secondary | ICD-10-CM

## 2024-08-15 NOTE — Telephone Encounter (Signed)
 LVM to call office and review recent Lung CT results. Statin therapy noted on med list.   IMPRESSION: 1. Lung-RADS 3, probably benign findings. Short-term follow-up in 6 months is recommended with repeat low-dose chest CT without contrast (please use the following order, CT CHEST LCS NODULE FOLLOW-UP W/O CM). Previously noted right middle lobe nodule is unchanged. 2. Multiple new scattered and tree-in-bud nodules, favored to be infectious/inflammatory. These nodules can be reassessed at the same time on follow-up CT to ensure resolution. 3. Aortic Atherosclerosis (ICD10-I70.0) and Emphysema (ICD10-J43.9). Coronary artery calcifications. Assessment for potential risk factor modification, dietary therapy or pharmacologic therapy may be warranted, if clinically indicated.

## 2024-08-20 ENCOUNTER — Encounter: Payer: Self-pay | Admitting: *Deleted

## 2024-08-20 NOTE — Telephone Encounter (Signed)
 Spoke with patient and reviewed recent lung screening CT results. Nodule of concern seen on last scan has decreased from 11mm to 10.49mm . A few new small nodules were noted on this scan that appear to be inflammatory in nature. Pt does report having some respiratory issues recently but is now feeling better. Advised pt that recommendation is to repeat CT in 6 months. Pt verbalized understanding and is in agreement. Order placed for 6 month nodule f/u CT.

## 2024-08-20 NOTE — Addendum Note (Signed)
 Addended by: ANITRA AQUAS D on: 08/20/2024 10:17 AM   Modules accepted: Orders

## 2024-08-20 NOTE — Telephone Encounter (Signed)
 Left VM and mychart message sent asking pt to contact us  to discuss results.

## 2024-09-06 ENCOUNTER — Other Ambulatory Visit: Payer: Self-pay | Admitting: Internal Medicine

## 2024-09-19 DIAGNOSIS — Z23 Encounter for immunization: Secondary | ICD-10-CM | POA: Diagnosis not present

## 2024-11-26 ENCOUNTER — Other Ambulatory Visit: Payer: Self-pay | Admitting: Student

## 2024-11-26 DIAGNOSIS — J441 Chronic obstructive pulmonary disease with (acute) exacerbation: Secondary | ICD-10-CM

## 2024-12-28 ENCOUNTER — Other Ambulatory Visit: Payer: Self-pay | Admitting: Student

## 2024-12-28 DIAGNOSIS — J441 Chronic obstructive pulmonary disease with (acute) exacerbation: Secondary | ICD-10-CM

## 2024-12-29 NOTE — Telephone Encounter (Signed)
 Patient last seen 03/03/24 I called the patient to schedule a appointment. I was unable to reach the patient. I lvm for him to give us  a call to schedule a appointment.

## 2024-12-30 ENCOUNTER — Other Ambulatory Visit: Payer: Self-pay | Admitting: Student

## 2024-12-30 DIAGNOSIS — J441 Chronic obstructive pulmonary disease with (acute) exacerbation: Secondary | ICD-10-CM

## 2025-01-26 ENCOUNTER — Ambulatory Visit (HOSPITAL_BASED_OUTPATIENT_CLINIC_OR_DEPARTMENT_OTHER)
